# Patient Record
Sex: Female | Born: 1960 | Race: White | Hispanic: No | Marital: Single | Smoking: Current every day smoker
Health system: Southern US, Community
[De-identification: ages and names within clinical notes are randomized; demographics above are authoritative.]

## PROBLEM LIST (undated history)

## (undated) DIAGNOSIS — F329 Major depressive disorder, single episode, unspecified: Secondary | ICD-10-CM

## (undated) DIAGNOSIS — F32A Depression, unspecified: Secondary | ICD-10-CM

## (undated) DIAGNOSIS — E785 Hyperlipidemia, unspecified: Secondary | ICD-10-CM

## (undated) HISTORY — DX: Depression, unspecified: F32.A

## (undated) HISTORY — DX: Major depressive disorder, single episode, unspecified: F32.9

---

## 2001-03-14 ENCOUNTER — Other Ambulatory Visit: Admission: RE | Admit: 2001-03-14 | Discharge: 2001-03-14 | Payer: Self-pay | Admitting: Obstetrics & Gynecology

## 2002-03-29 ENCOUNTER — Other Ambulatory Visit: Admission: RE | Admit: 2002-03-29 | Discharge: 2002-03-29 | Payer: Self-pay | Admitting: Obstetrics & Gynecology

## 2003-04-04 ENCOUNTER — Other Ambulatory Visit: Admission: RE | Admit: 2003-04-04 | Discharge: 2003-04-04 | Payer: Self-pay | Admitting: Obstetrics & Gynecology

## 2003-08-23 ENCOUNTER — Ambulatory Visit (HOSPITAL_COMMUNITY): Admission: RE | Admit: 2003-08-23 | Discharge: 2003-08-23 | Payer: Self-pay | Admitting: Cardiothoracic Surgery

## 2003-08-27 ENCOUNTER — Ambulatory Visit (HOSPITAL_COMMUNITY): Admission: RE | Admit: 2003-08-27 | Discharge: 2003-08-27 | Payer: Self-pay | Admitting: Cardiothoracic Surgery

## 2003-08-31 ENCOUNTER — Encounter: Admission: RE | Admit: 2003-08-31 | Discharge: 2003-08-31 | Payer: Self-pay | Admitting: Cardiothoracic Surgery

## 2003-09-12 ENCOUNTER — Encounter: Admission: RE | Admit: 2003-09-12 | Discharge: 2003-09-12 | Payer: Self-pay | Admitting: Cardiothoracic Surgery

## 2003-10-19 ENCOUNTER — Encounter: Admission: RE | Admit: 2003-10-19 | Discharge: 2003-10-19 | Payer: Self-pay | Admitting: Cardiothoracic Surgery

## 2003-12-21 ENCOUNTER — Encounter: Admission: RE | Admit: 2003-12-21 | Discharge: 2003-12-21 | Payer: Self-pay | Admitting: Cardiothoracic Surgery

## 2004-04-04 ENCOUNTER — Other Ambulatory Visit: Admission: RE | Admit: 2004-04-04 | Discharge: 2004-04-04 | Payer: Self-pay | Admitting: Obstetrics & Gynecology

## 2004-04-07 ENCOUNTER — Other Ambulatory Visit: Admission: RE | Admit: 2004-04-07 | Discharge: 2004-04-07 | Payer: Self-pay | Admitting: Obstetrics & Gynecology

## 2004-05-23 ENCOUNTER — Encounter: Admission: RE | Admit: 2004-05-23 | Discharge: 2004-05-23 | Payer: Self-pay | Admitting: Cardiothoracic Surgery

## 2005-05-08 ENCOUNTER — Other Ambulatory Visit: Admission: RE | Admit: 2005-05-08 | Discharge: 2005-05-08 | Payer: Self-pay | Admitting: Obstetrics & Gynecology

## 2011-10-13 ENCOUNTER — Ambulatory Visit (INDEPENDENT_AMBULATORY_CARE_PROVIDER_SITE_OTHER): Payer: Managed Care, Other (non HMO) | Admitting: Psychology

## 2011-10-13 ENCOUNTER — Encounter (HOSPITAL_COMMUNITY): Payer: Self-pay | Admitting: Psychology

## 2011-10-13 DIAGNOSIS — F329 Major depressive disorder, single episode, unspecified: Secondary | ICD-10-CM

## 2011-10-13 DIAGNOSIS — Z634 Disappearance and death of family member: Secondary | ICD-10-CM
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
## 2011-10-20 ENCOUNTER — Ambulatory Visit (INDEPENDENT_AMBULATORY_CARE_PROVIDER_SITE_OTHER): Payer: Managed Care, Other (non HMO) | Admitting: Psychology

## 2011-10-20 DIAGNOSIS — Z634 Disappearance and death of family member: Secondary | ICD-10-CM

## 2011-10-20 DIAGNOSIS — F4329 Adjustment disorder with other symptoms: Secondary | ICD-10-CM

## 2011-10-20 DIAGNOSIS — F4320 Adjustment disorder, unspecified: Secondary | ICD-10-CM
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
## 2011-10-27 ENCOUNTER — Ambulatory Visit (INDEPENDENT_AMBULATORY_CARE_PROVIDER_SITE_OTHER): Payer: Managed Care, Other (non HMO) | Admitting: Psychology

## 2011-10-27 DIAGNOSIS — F4329 Adjustment disorder with other symptoms: Secondary | ICD-10-CM

## 2011-10-27 DIAGNOSIS — F4321 Adjustment disorder with depressed mood: Secondary | ICD-10-CM

## 2011-10-27 DIAGNOSIS — F4310 Post-traumatic stress disorder, unspecified: Secondary | ICD-10-CM
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
## 2011-11-03 ENCOUNTER — Ambulatory Visit (INDEPENDENT_AMBULATORY_CARE_PROVIDER_SITE_OTHER): Payer: Managed Care, Other (non HMO) | Admitting: Psychology

## 2011-11-03 DIAGNOSIS — F329 Major depressive disorder, single episode, unspecified: Secondary | ICD-10-CM

## 2011-11-03 DIAGNOSIS — F4310 Post-traumatic stress disorder, unspecified: Secondary | ICD-10-CM

## 2011-11-03 DIAGNOSIS — Z634 Disappearance and death of family member: Secondary | ICD-10-CM

## 2011-11-03 NOTE — Progress Notes (Signed)
   THERAPIST PROGRESS NOTE  Session Time: 1030 - 1125  Participation Level: Active  Behavioral Response: Fairly GroomedAlertAnxious and Depressed and overwhelmed with the need to make many decisions  Type of Therapy: Individual Therapy  Treatment Goals addressed: Coping  Interventions: Psychosocial Skills: techniques for holding onto favorite memories, Supportive and Reframing  Summary: Rhonda Perkins is a 51 y.o. female who presents with so much acute stress that she is constantly overwhelmed with all that she needs to do and to decide. She is at the point of needing to sign a building contract and to have her parents house finally demolished so the new house can be built.  She dreads the house coming down, because it will signal the finality of her mother's death and the lose of hope of things "being the same again".  She understands the need to go ahead and get the building started, because she hates living in an apartment.  We have been working for several weeks on ways to hold her mother close while letting her home go.  This weekend, she was able to say what plants she wanted to salvage and also planted some strawberries with her Dad in memory of her mother.  Suicidal/Homicidal: Nowithout intent/plan  Therapist Response: Validated her loss and the difficulty of grieving when all the decisions are up to her and people are pushing her with daily calls to move on.  She was less tearful today, has found some things to do that are comforting to her (brushed the dog for 2 hours!).  She expressed determination to get the house decision done this coming week.  Asked her about whether to watch to demolition or avoid it -- believes she will be there, but can also be distracted caring for the dogs and keeping them safe during that time.  Plan: Return again in 1 weeks. Support of her grieving process and the very real burden she is living under at this time.  Diagnosis: Axis I: Bereavement, Major  Depression, single episode and Post Traumatic Stress Disorder    Axis II: No diagnosis    Derinda Bartus, RN 11/03/2011

## 2011-11-17 ENCOUNTER — Ambulatory Visit (INDEPENDENT_AMBULATORY_CARE_PROVIDER_SITE_OTHER): Payer: Managed Care, Other (non HMO) | Admitting: Psychology

## 2011-11-17 DIAGNOSIS — Z634 Disappearance and death of family member: Secondary | ICD-10-CM

## 2011-11-17 DIAGNOSIS — F4310 Post-traumatic stress disorder, unspecified: Secondary | ICD-10-CM
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
## 2011-12-04 ENCOUNTER — Ambulatory Visit (INDEPENDENT_AMBULATORY_CARE_PROVIDER_SITE_OTHER): Payer: Managed Care, Other (non HMO) | Admitting: Psychology

## 2011-12-04 DIAGNOSIS — F4310 Post-traumatic stress disorder, unspecified: Secondary | ICD-10-CM

## 2011-12-04 NOTE — Progress Notes (Signed)
   THERAPIST PROGRESS NOTE  Session Time: 1520 - 1610  Participation Level: Active  Behavioral Response: Neat and very thinAlertAngry, Anxious and Depressed  Type of Therapy: Individual Therapy  Treatment Goals addressed: Anger and Coping  Interventions: Supportive and Reframing  Summary: Rhonda Perkins is a 51 y.o. female who presents with tense affect and having had "a hell of a week".  She continues to work full time while caring for her father and then her brother was moved to a "rehab" facility that she has found to be inadequate in caring for his needs.  Therefore, she has "had" to go there every evening and supervise his care the best she can.  She has initiated complaints about the facility at all levels up to the state level, and so far has been able to make her wishes known directly and get some action at the home.  But the strain of knowing he is there and the effort to continue to keep up with the frequent contacts with insurance agents, banks, and now trying to get [name]'s disability payments initiated is taking a big toll on her physically.    She remains logical, assertive and able to do the tasks individually, but is getting tired and overwhelmed, as demonstrated by her lack of appetite and very poor diet.  When she eats it is mostly easy to grab carbs and very little protein.    Suicidal/Homicidal: Nowithout intent/plan  Therapist Response:   Recognized that she is doing the best she can do at this time.  Gave her the opportunity to vent and together we concluded that the nursing home will do better with [name] over time, as the staff get to know him and his needs better.  Still, it was appropriate for her to complain.  Suggested that she needs to be in contact with the social worker at the home and have someone participate in his treatment planning session.  She immediately thought about who could do this if she cannot be there because of work.  Plan: Return again in 2  weeks.  We continue to focus on supporting her to cope with all of her caretaking duties while not letting her get away with assuming she has to do it all alone.    Diagnosis: Axis I: Adjustment Disorder with Mixed Emotional Features and Post Traumatic Stress Disorder    Axis II: No diagnosis    Eron Goble, RN 12/04/2011

## 2011-12-14 ENCOUNTER — Ambulatory Visit (HOSPITAL_COMMUNITY): Payer: Self-pay | Admitting: Licensed Clinical Social Worker

## 2011-12-29 ENCOUNTER — Ambulatory Visit (INDEPENDENT_AMBULATORY_CARE_PROVIDER_SITE_OTHER): Payer: Managed Care, Other (non HMO) | Admitting: Psychology

## 2011-12-29 DIAGNOSIS — F322 Major depressive disorder, single episode, severe without psychotic features: Secondary | ICD-10-CM

## 2011-12-29 DIAGNOSIS — Z634 Disappearance and death of family member: Secondary | ICD-10-CM
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
## 2012-01-05 ENCOUNTER — Ambulatory Visit (INDEPENDENT_AMBULATORY_CARE_PROVIDER_SITE_OTHER): Payer: Managed Care, Other (non HMO) | Admitting: Psychology

## 2012-01-05 DIAGNOSIS — F4310 Post-traumatic stress disorder, unspecified: Secondary | ICD-10-CM

## 2012-01-05 NOTE — Progress Notes (Signed)
   THERAPIST PROGRESS NOTE  Session Time: 1505 - 1555  Participation Level: Active  Behavioral Response: Casual and NeatAlertDysphoric, and tense  Type of Therapy: Individual Therapy  Treatment Goals addressed: Coping  Interventions: Supportive  Summary: ALFREDO COLLYMORE is a 51 y.o. female who presents with less agitation than last week.  No major crisis has come up this week.  Her brother was released from the hospital today, back to the rehab unit.  The house is being built, with the foundation nearly completed.  Her Dad is staying busy watching the progress on the house.  She is going to work regularly, but still has little appetite, eating cereal often as a meal.  She did get out this past weekend, taking her Dad to yard sales and a road trip with her sister.    Suicidal/Homicidal: Nowithout intent/plan  Therapist Response: We continued to work on Pharmacologist for anxiety and tension.  She reports she has been using the breathing technique we talked about last week.  Today, I taught her how to do progressive muscle relaxation and we tried it out together.  She noticed good effect from this and we talked about using it before bed.  I also encouraged her to do something for herself during the next hour, because she is not expected home until after 5 pm.  She thought she would like to window shop.  We reviewed her progress over the 3 months and said our goodbyes.    Plan: Return again to see new therapist at first available appointment.  Diagnosis: Axis I: Depressive Disorder NOS and Post Traumatic Stress Disorder    Axis II: No diagnosis    Jefte Carithers, RN 01/05/2012

## 2012-01-19 ENCOUNTER — Ambulatory Visit (INDEPENDENT_AMBULATORY_CARE_PROVIDER_SITE_OTHER): Payer: Managed Care, Other (non HMO) | Admitting: Psychology

## 2012-01-19 DIAGNOSIS — F321 Major depressive disorder, single episode, moderate: Secondary | ICD-10-CM

## 2012-01-20 NOTE — Progress Notes (Signed)
   THERAPIST PROGRESS NOTE  Session Time: 3pm-4pm  Participation Level: Active  Behavioral Response: Well GroomedAlertDysphoric  Type of Therapy: Individual Therapy  Treatment Goals addressed: Coping and Diagnosis: MDD and bereavement  Interventions: CBT and Other: Heartmath Quick Coherence Technique  Summary: Rhonda Perkins is a 51 y.o. female who presents with reported feeling overwhelmed by all responsibilities as caretaker for father, brother and new home being built.  Pt discussed the stressors of brother being readmitted to the hospital for pneumonia and not feeling he had good care at rehab center.  Pt also discusses the stress of everyone looking to her to do things- pt was able to recognize she has her own limitations but speaks of being the one who will get it done.  Pt discussed grief of mother "on back burner" w/ all the caretaker she is doing- but acknowledged that 01/22/12 will be difficult as this is her mother's birthday.  Pt agreed to take some time to remember mother in positive way.  Pt also participated in heartmath breathing exercise- reporting will continue to practice on own.  Suicidal/Homicidal: Nowithout intent/plan  Therapist Response: Acknowledged pt transition to new therapist and time can take to become comfortable w/ new provider. Assessed pt current functioning per her report.  acknowledged and validated stressors.  Assisted pt in reframing that she doesn't have to "do it all" and accepting self limitations and seeking assistance of others.   Disucssed mother's birthday coming up and importance of memorializing w/ loved ones on this day.  Introduced pt to heart math quick coherence technique for stress relief. Plan: Return again in 2 weeks.  Diagnosis: Axis I: Bereavement and MDD     Axis II: No diagnosis    YATES,LEANNE, LPC 01/20/2012

## 2012-02-03 ENCOUNTER — Ambulatory Visit (INDEPENDENT_AMBULATORY_CARE_PROVIDER_SITE_OTHER): Payer: Managed Care, Other (non HMO) | Admitting: Psychology

## 2012-02-03 DIAGNOSIS — F321 Major depressive disorder, single episode, moderate: Secondary | ICD-10-CM

## 2012-02-03 NOTE — Progress Notes (Signed)
   THERAPIST PROGRESS NOTE  Session Time: 4:05pm-4:48pm  Participation Level: Active  Behavioral Response: Well GroomedAlert and DrowsyDepressed  Type of Therapy: Individual Therapy  Treatment Goals addressed: Diagnosis: MDD and bereavement  Interventions: Strength-based and Supportive  Summary: Rhonda Perkins is a 51 y.o. female who presents with reported feeling of exhaustion.  Pt appears tired in session.  Pt however was alert and participated in session .  Pt reported that her brother was d/c yesterday and is home w/ outpt therapy.  Pt reported that she is crashing at night- but not feeling rested and discusses feeling overwhelmed w/ responsibilities but carrying on w/ accomplishing them.  Pt identified need to find time for some rest for self and positive self care for self to maintain her wellness.   Suicidal/Homicidal: Nowithout intent/plan  Therapist Response: ASsessed pt current functioning per her report.  Validated pt feelings and stressors as overwhelming.  Discussed impact having on her wellbeing and feelings of exhaustion.  Encouraged pt need for finding time for herself- not taking on more responsibilities and turning over responsibilities to others as much as possible.    Plan: Return again in 2-3 weeks.  Diagnosis: Axis I: Major Depression, single episode    Axis II: No diagnosis    YATES,LEANNE, LPC 02/03/2012

## 2012-02-29 ENCOUNTER — Ambulatory Visit (HOSPITAL_COMMUNITY): Payer: Self-pay | Admitting: Psychology

## 2012-02-29 ENCOUNTER — Telehealth (HOSPITAL_COMMUNITY): Payer: Self-pay | Admitting: Psychology

## 2012-02-29 NOTE — Telephone Encounter (Signed)
Called re: missed appointment today.  Left message on voice mail.

## 2012-06-27 ENCOUNTER — Encounter (HOSPITAL_COMMUNITY): Payer: Self-pay | Admitting: Psychology

## 2012-06-27 DIAGNOSIS — Z634 Disappearance and death of family member: Secondary | ICD-10-CM

## 2012-06-27 DIAGNOSIS — F329 Major depressive disorder, single episode, unspecified: Secondary | ICD-10-CM

## 2012-06-27 NOTE — Progress Notes (Signed)
Patient ID: Rhonda Perkins, female   DOB: 1961/01/16, 52 y.o.   MRN: [id number] Outpatient Therapist Discharge Summary  CANDY LEVERETT    02/01/1961   Admission Date: 10/13/11   Discharge Date:  06/28/11 Reason for Discharge:  Pt hadn't returned since 01/2012 Diagnosis:  1. Bereavement, uncomplicated   2. Depression      Comments:  Pt was first in tx w/ Shonna Chock till her retirement in 12/2011.  Pt f/u 2 times w/ new provider.  Forde Radon

## 2016-01-10 ENCOUNTER — Observation Stay (HOSPITAL_COMMUNITY)
Admission: EM | Admit: 2016-01-10 | Discharge: 2016-01-11 | Disposition: A | Discharge status: Home / Self Care | Payer: Managed Care, Other (non HMO) | Attending: Family Medicine | Admitting: Family Medicine

## 2016-01-10 ENCOUNTER — Emergency Department (HOSPITAL_COMMUNITY): Payer: Managed Care, Other (non HMO)

## 2016-01-10 ENCOUNTER — Encounter (HOSPITAL_COMMUNITY): Payer: Self-pay | Admitting: Neurology

## 2016-01-10 ENCOUNTER — Observation Stay (HOSPITAL_COMMUNITY): Payer: Managed Care, Other (non HMO)

## 2016-01-10 DIAGNOSIS — E785 Hyperlipidemia, unspecified: Secondary | ICD-10-CM | POA: Diagnosis not present

## 2016-01-10 DIAGNOSIS — M79602 Pain in left arm: Secondary | ICD-10-CM | POA: Diagnosis not present

## 2016-01-10 DIAGNOSIS — Z7982 Long term (current) use of aspirin: Secondary | ICD-10-CM | POA: Diagnosis not present

## 2016-01-10 DIAGNOSIS — F17210 Nicotine dependence, cigarettes, uncomplicated: Secondary | ICD-10-CM | POA: Diagnosis not present

## 2016-01-10 DIAGNOSIS — E7800 Pure hypercholesterolemia, unspecified: Secondary | ICD-10-CM | POA: Diagnosis not present

## 2016-01-10 DIAGNOSIS — R531 Weakness: Secondary | ICD-10-CM | POA: Insufficient documentation

## 2016-01-10 DIAGNOSIS — G459 Transient cerebral ischemic attack, unspecified: Secondary | ICD-10-CM

## 2016-01-10 DIAGNOSIS — I10 Essential (primary) hypertension: Secondary | ICD-10-CM | POA: Diagnosis not present

## 2016-01-10 DIAGNOSIS — R29898 Other symptoms and signs involving the musculoskeletal system: Secondary | ICD-10-CM | POA: Diagnosis not present

## 2016-01-10 DIAGNOSIS — R200 Anesthesia of skin: Secondary | ICD-10-CM | POA: Diagnosis not present

## 2016-01-10 DIAGNOSIS — F329 Major depressive disorder, single episode, unspecified: Secondary | ICD-10-CM | POA: Diagnosis not present

## 2016-01-10 LAB — COMPREHENSIVE METABOLIC PANEL
ALK PHOS: 65 U/L (ref 38–126)
ALT: 16 U/L (ref 14–54)
ANION GAP: 7 (ref 5–15)
AST: 20 U/L (ref 15–41)
Albumin: 4.4 g/dL (ref 3.5–5.0)
BILIRUBIN TOTAL: 1.3 mg/dL — AB (ref 0.3–1.2)
BUN: <5 mg/dL — ABNORMAL LOW (ref 6–20)
CALCIUM: 9.8 mg/dL (ref 8.9–10.3)
CO2: 26 mmol/L (ref 22–32)
CREATININE: 0.73 mg/dL (ref 0.44–1.00)
Chloride: 109 mmol/L (ref 101–111)
Glucose, Bld: 86 mg/dL (ref 65–99)
Potassium: 4 mmol/L (ref 3.5–5.1)
SODIUM: 142 mmol/L (ref 135–145)
TOTAL PROTEIN: 6.8 g/dL (ref 6.5–8.1)

## 2016-01-10 LAB — CBC
HEMATOCRIT: 41.5 % (ref 36.0–46.0)
Hemoglobin: 13.8 g/dL (ref 12.0–15.0)
MCH: 29.1 pg (ref 26.0–34.0)
MCHC: 33.3 g/dL (ref 30.0–36.0)
MCV: 87.4 fL (ref 78.0–100.0)
PLATELETS: 235 10*3/uL (ref 150–400)
RBC: 4.75 MIL/uL (ref 3.87–5.11)
RDW: 12.9 % (ref 11.5–15.5)
WBC: 7.2 10*3/uL (ref 4.0–10.5)

## 2016-01-10 LAB — I-STAT CHEM 8, ED
BUN: 8 mg/dL (ref 6–20)
CALCIUM ION: 1.2 mmol/L (ref 1.13–1.30)
Chloride: 106 mmol/L (ref 101–111)
Creatinine, Ser: 0.7 mg/dL (ref 0.44–1.00)
GLUCOSE: 82 mg/dL (ref 65–99)
HCT: 41 % (ref 36.0–46.0)
HEMOGLOBIN: 13.9 g/dL (ref 12.0–15.0)
Potassium: 4.2 mmol/L (ref 3.5–5.1)
SODIUM: 145 mmol/L (ref 135–145)
TCO2: 29 mmol/L (ref 0–100)

## 2016-01-10 LAB — DIFFERENTIAL
Basophils Absolute: 0 10*3/uL (ref 0.0–0.1)
Basophils Relative: 0 %
EOS PCT: 1 %
Eosinophils Absolute: 0.1 10*3/uL (ref 0.0–0.7)
LYMPHS PCT: 42 %
Lymphs Abs: 3 10*3/uL (ref 0.7–4.0)
MONO ABS: 0.4 10*3/uL (ref 0.1–1.0)
MONOS PCT: 6 %
Neutro Abs: 3.7 10*3/uL (ref 1.7–7.7)
Neutrophils Relative %: 51 %

## 2016-01-10 LAB — TSH: TSH: 1.556 u[IU]/mL (ref 0.350–4.500)

## 2016-01-10 LAB — PROTIME-INR
INR: 1.07 (ref 0.00–1.49)
PROTHROMBIN TIME: 14.1 s (ref 11.6–15.2)

## 2016-01-10 LAB — APTT: aPTT: 32 seconds (ref 24–37)

## 2016-01-10 LAB — I-STAT TROPONIN, ED: TROPONIN I, POC: 0 ng/mL (ref 0.00–0.08)

## 2016-01-10 MED ORDER — SENNOSIDES-DOCUSATE SODIUM 8.6-50 MG PO TABS
1.0000 | ORAL_TABLET | Freq: Every evening | ORAL | Status: DC | PRN
Start: 2016-01-10 — End: 2016-01-11

## 2016-01-10 MED ORDER — SODIUM CHLORIDE 0.9 % IV SOLN
INTRAVENOUS | Status: AC
  Administered 2016-01-10: 18:00:00 via INTRAVENOUS

## 2016-01-10 MED ORDER — ASPIRIN 81 MG PO CHEW
81.0000 mg | CHEWABLE_TABLET | Freq: Every day | ORAL | Status: DC
  Administered 2016-01-10 – 2016-01-11 (×2): 81 mg via ORAL
  Filled 2016-01-10 (×2): qty 1

## 2016-01-10 MED ORDER — ENOXAPARIN SODIUM 40 MG/0.4ML ~~LOC~~ SOLN
40.0000 mg | SUBCUTANEOUS | Status: DC
  Administered 2016-01-10: 40 mg via SUBCUTANEOUS
  Filled 2016-01-10: qty 0.4

## 2016-01-10 MED ORDER — SIMVASTATIN 20 MG PO TABS
20.0000 mg | ORAL_TABLET | Freq: Every day | ORAL | Status: DC
  Administered 2016-01-10: 20 mg via ORAL
  Filled 2016-01-10: qty 1

## 2016-01-10 MED ORDER — STROKE: EARLY STAGES OF RECOVERY BOOK
Freq: Once | Status: AC
  Administered 2016-01-10: 19:00:00

## 2016-01-10 NOTE — Progress Notes (Signed)
Pt arrived to 5M01 @ 1618, Pt A&Ox 4, c/o pain 0/10.  Pt VS taken, Pt without distress. Family at the bedside. Diet ordered, will monitor.

## 2016-01-10 NOTE — ED Notes (Signed)
Was up really early and was weed eating her yard and she went to put up  It up and her left arrm felt  Weak  And  It would not move , went to [facility] and was sent here for further tests, pt now able to move arm  Well, has normal ROM ,Without drift, no slurred speech

## 2016-01-10 NOTE — ED Notes (Signed)
Pt transported to CT ?

## 2016-01-10 NOTE — ED Provider Notes (Signed)
CSN: [id number]     Arrival date & time 01/10/16  1302 History   First MD Initiated Contact with Patient 01/10/16 1343     Chief Complaint  Patient presents with  . Weakness     (Consider location/radiation/quality/duration/timing/severity/associated sxs/prior Treatment) HPI   Patient is a 55 year old female with past medical history of high cholesterol who presents to the ED with complaint of intermittent left arm weakness, onset 7:30 AM. Patient reports while she was out doing yard work this morning, after using a weed whacker she noticed she was unable to move her left arm. Patient reports she had total left arm weakness for approximately 15 minutes and then notes her strength began to gradually return over the next 30-45 minutes. Patient denies having any similar episodes in the past. She notes she went to her PCPs office but was unable to get an resulting her going to an urgent care. Once patient was evaluated at the urgent care she was advised to come to the ED for further evaluation. Patient currently denies having any pain or complaints at this time. Denies fever, headache, visual changes, lightheadedness, dizziness, slurred speech, confusion, cough, shortness of breath, chest pain, palpitations, abdominal pain, nausea, vomiting, urinary symptoms, numbness, tingling, weakness. Patient reports she has been out of her cholesterol medicine for the past 1.5 weeks. Patient also endorses smoking 1/4 pack per day.   Past Medical History  Diagnosis Date  . Depression    No past surgical history on file. Family History  Problem Relation Age of Onset  . Depression Brother   . Depression Brother   . Seizures Brother    Social History  Substance Use Topics  . Smoking status: Current Every Day Smoker -- 0.25 packs/day for 28 years  . Smokeless tobacco: Never Used  . Alcohol Use: No   OB History    No data available     Review of Systems  Neurological: Positive for weakness (left arm).   All other systems reviewed and are negative.     Allergies  Review of patient's allergies indicates no known allergies.  Home Medications   Prior to Admission medications   Medication Sig Start Date End Date Taking? Authorizing Provider  LORazepam (ATIVAN) 0.5 MG tablet  09/07/11   Historical Provider, MD  mirtazapine (REMERON) 30 MG tablet Take 30 mg by mouth at bedtime.    Historical Provider, MD  PRISTIQ 50 MG 24 hr tablet  09/18/11   Historical Provider, MD  zolpidem (AMBIEN) 10 MG tablet  09/18/11   Historical Provider, MD   BP 142/78 mmHg  Pulse 75  Temp(Src) 97.9 F (36.6 C) (Oral)  Resp 14  Ht 5\' 7"  (1.702 m)  Wt 55.339 kg  BMI 19.10 kg/m2  SpO2 100% Physical Exam  Constitutional: She is oriented to person, place, and time. She appears well-developed and well-nourished. No distress.  HENT:  Head: Normocephalic and atraumatic.  Eyes: Conjunctivae and EOM are normal. Pupils are equal, round, and reactive to light. Right eye exhibits no discharge. Left eye exhibits no discharge. No scleral icterus.  Neck: Normal range of motion. Neck supple.  Cardiovascular: Normal rate, regular rhythm, normal heart sounds and intact distal pulses.   Pulmonary/Chest: Effort normal and breath sounds normal. No respiratory distress. She has no wheezes. She has no rales. She exhibits no tenderness.  Abdominal: Soft. Bowel sounds are normal. She exhibits no distension. There is no tenderness.  Musculoskeletal: Normal range of motion. She exhibits no edema.  FROM of  BUE with 5/5 strength. Sensation grossly intact. 2+ radial pulses. Equal grip strength bilaterally. Cap refill less than 2.  Neurological: She is alert and oriented to person, place, and time. She has normal strength. No cranial nerve deficit or sensory deficit. She displays a negative Romberg sign. Coordination normal.  Normal finger-nose-finger  Skin: Skin is warm and dry. She is not diaphoretic.  Nursing note and vitals  reviewed.   ED Course  Procedures (including critical care time) Labs Review Labs Reviewed  COMPREHENSIVE METABOLIC PANEL - Abnormal; Notable for the following:    BUN <5 (*)    Total Bilirubin 1.3 (*)    All other components within normal limits  PROTIME-INR  APTT  CBC  DIFFERENTIAL  I-STAT TROPOININ, ED  CBG MONITORING, ED  I-STAT CHEM 8, ED    Imaging Review Ct Head Wo Contrast  01/10/2016  CLINICAL DATA:  Patient had numbness in her left arm after weeding eating at 7am EXAM: CT HEAD WITHOUT CONTRAST TECHNIQUE: Contiguous axial images were obtained from the base of the skull through the vertex without intravenous contrast. COMPARISON:  08/23/2003 FINDINGS: Brain: No evidence of acute infarction, hemorrhage, extra-axial collection, ventriculomegaly, or mass effect. Vascular: No hyperdense vessel or unexpected calcification. Skull: Negative for fracture or focal lesion. Sinuses/Orbits: Normal orbits and globes. Clear paranasal sinuses and mastoid air cells. Other: None. IMPRESSION: Normal head CT. Electronically Signed   By: Jeronimo Greaves M.D.   On: 01/10/2016 14:43   I have personally reviewed and evaluated these images and lab results as part of my medical decision-making.   EKG Interpretation None      MDM   Final diagnoses:  Transient cerebral ischemia, unspecified transient cerebral ischemia type  Left arm weakness   Patient presents with transient episode of left arm weakness that started around 7:30 AM. Patient reports symptoms resolved spontaneously after approximately 15 minutes. Denies any other associated symptoms. Endorses history of high cholesterol and smoking daily. VSS. Exam unremarkable, no neuro deficits. Full strength and range of motion of bilateral upper extremities, BUE neurovascularly intact. Labs unremarkable. Head CT negative. EKG showed sinus rhythm. Consults with neurology. Dr. Amada Jupiter advised to have pt admitted to medicine for further TIA workup  and notes they will come evaluate the pt. Consulted hospitalist. Dr. Jimmey Ralph agrees to admission. Orders placed for admission. Discussed results and plan for admission with pt.    Satira Sark [place], [place] 01/10/16 1512   Loren Racer, MD 01/26/16 [phone]

## 2016-01-10 NOTE — Consult Note (Signed)
Requesting Physician: Dr. Jennette Kettle    Chief Complaint: TIA  History obtained from:  Patient     HPI:                                                                                                                                         Rhonda Perkins is an 55 y.o. female with past medical history of high cholesterol and tobacco abuse who presents to the ED with complaint of intermittent left arm weakness, onset 7:30 AM. Patient reports while she was out doing yard work this morning, after using a weed whacker she noticed she was unable to move her left arm. Patient reports she had total left arm weakness for approximately 15 minutes and then notes her strength began to gradually return over the next 30-45 minutes. Patient denies having any similar episodes in the past. She notes she went to her PCPs office but was unable to get an resulting her going to an urgent care. Once patient was evaluated at the urgent care she was advised to come to the ED for further evaluation. Currently back to baseline.   Date last known well: Today Time last known well: Time: 07:30 tPA Given: No: symptoms resolved   Past Medical History  Diagnosis Date  . Depression     No past surgical history on file.  Family History  Problem Relation Age of Onset  . Depression Brother   . Depression Brother   . Seizures Brother   . Hyperlipidemia Mother   . Hyperlipidemia Father    Social History:  reports that she has been smoking.  She has never used smokeless tobacco. She reports that she does not drink alcohol or use illicit drugs.  Allergies: Allergies no known allergies  Medications:                                                                                                                           No current facility-administered medications for this encounter.   Current Outpatient Prescriptions  Medication Sig Dispense Refill  . LORazepam (ATIVAN) 0.5 MG tablet     . mirtazapine (REMERON) 30 MG tablet  Take 30 mg by mouth at bedtime.    Marland Kitchen PRISTIQ 50 MG 24 hr tablet     . zolpidem (AMBIEN) 10 MG tablet        ROS:  History obtained from the patient  General ROS: negative for - chills, fatigue, fever, night sweats, weight gain or weight loss Psychological ROS: negative for - behavioral disorder, hallucinations, memory difficulties, mood swings or suicidal ideation Ophthalmic ROS: negative for - blurry vision, double vision, eye pain or loss of vision ENT ROS: negative for - epistaxis, nasal discharge, oral lesions, sore throat, tinnitus or vertigo Allergy and Immunology ROS: negative for - hives or itchy/watery eyes Hematological and Lymphatic ROS: negative for - bleeding problems, bruising or swollen lymph nodes Endocrine ROS: negative for - galactorrhea, hair pattern changes, polydipsia/polyuria or temperature intolerance Respiratory ROS: negative for - cough, hemoptysis, shortness of breath or wheezing Cardiovascular ROS: negative for - chest pain, dyspnea on exertion, edema or irregular heartbeat Gastrointestinal ROS: negative for - abdominal pain, diarrhea, hematemesis, nausea/vomiting or stool incontinence Genito-Urinary ROS: negative for - dysuria, hematuria, incontinence or urinary frequency/urgency Musculoskeletal ROS: negative for - joint swelling or muscular weakness Neurological ROS: as noted in HPI Dermatological ROS: negative for rash and skin lesion changes  Neurologic Examination:                                                                                                      Blood pressure 141/70, pulse 73, temperature 97.9 F (36.6 C), temperature source Oral, resp. rate 17, height 5\' 7"  (1.702 m), weight 122 lb (55.339 kg), SpO2 99 %.  HEENT-  Normocephalic, no lesions, without obvious abnormality.  Normal external eye and conjunctiva.   Normal TM's bilaterally.  Normal auditory canals and external ears. Normal external nose, mucus membranes and septum.  Normal pharynx. Cardiovascular- S1, S2 normal, pulses palpable throughout   Lungs- chest clear, no wheezing, rales, normal symmetric air entry Abdomen- normal findings: bowel sounds normal Extremities- no edema Lymph-no adenopathy palpable Musculoskeletal-no joint tenderness, deformity or swelling Skin-warm and dry, no hyperpigmentation, vitiligo, or suspicious lesions  Neurological Examination Mental Status: Alert, oriented, thought content appropriate.  Speech fluent without evidence of aphasia.  Able to follow 3 step commands without difficulty. Cranial Nerves: II: ; Visual fields grossly normal, pupils equal, round, reactive to light and accommodation III,IV, VI: ptosis not present, extra-ocular motions intact bilaterally V,VII: smile symmetric, facial light touch sensation normal bilaterally VIII: hearing normal bilaterally IX,X: uvula rises symmetrically XI: bilateral shoulder shrug XII: midline tongue extension Motor: Right : Upper extremity   5/5    Left:     Upper extremity   5/5  Lower extremity   5/5     Lower extremity   5/5 Tone and bulk:normal tone throughout; no atrophy noted Sensory: Pinprick and light touch intact throughout, bilaterally Deep Tendon Reflexes: 2+ and symmetric throughout Plantars: Right: downgoing   Left: downgoing Cerebellar: normal finger-to-nose and normal heel-to-shin test Gait: normal gait and station       Lab Results: Basic Metabolic Panel:  Recent Labs Lab 01/10/16 1318 01/10/16 1326  NA 142 145  K 4.0 4.2  CL 109 106  CO2 26  --   GLUCOSE 86 82  BUN <5* 8  CREATININE 0.73 0.70  CALCIUM  9.8  --     Liver Function Tests:  Recent Labs Lab 01/10/16 1318  AST 20  ALT 16  ALKPHOS 65  BILITOT 1.3*  PROT 6.8  ALBUMIN 4.4   No results for input(s): LIPASE, AMYLASE in the last 168 hours. No results for  input(s): AMMONIA in the last 168 hours.  CBC:  Recent Labs Lab 01/10/16 1318 01/10/16 1326  WBC 7.2  --   NEUTROABS 3.7  --   HGB 13.8 13.9  HCT 41.5 41.0  MCV 87.4  --   PLT 235  --     Cardiac Enzymes: No results for input(s): CKTOTAL, CKMB, CKMBINDEX, TROPONINI in the last 168 hours.  Lipid Panel: No results for input(s): CHOL, TRIG, HDL, CHOLHDL, VLDL, LDLCALC in the last 168 hours.  CBG: No results for input(s): GLUCAP in the last 168 hours.  Microbiology: No results found for this or any previous visit.  Coagulation Studies:  Recent Labs  01/10/16 1318  LABPROT 14.1  INR 1.07    Imaging: Ct Head Wo Contrast  01/10/2016  CLINICAL DATA:  Patient had numbness in her left arm after weeding eating at 7am EXAM: CT HEAD WITHOUT CONTRAST TECHNIQUE: Contiguous axial images were obtained from the base of the skull through the vertex without intravenous contrast. COMPARISON:  08/23/2003 FINDINGS: Brain: No evidence of acute infarction, hemorrhage, extra-axial collection, ventriculomegaly, or mass effect. Vascular: No hyperdense vessel or unexpected calcification. Skull: Negative for fracture or focal lesion. Sinuses/Orbits: Normal orbits and globes. Clear paranasal sinuses and mastoid air cells. Other: None. IMPRESSION: Normal head CT. Electronically Signed   By: Jeronimo GreavesKyle  Talbot M.D.   On: 01/10/2016 14:43       Assessment and plan discussed with with attending physician and they are in agreement.    Felicie MornDavid Smith PA-C [facility] [phone]  01/10/2016, 3:51 PM   Assessment: 55 y.o. female with transient left arm numbness and weakness most consistent with TIA. She is being admitted for TIA workup.  Stroke Risk Factors - none  1. HgbA1c, fasting lipid panel 2. MRI, MRA  of the brain without contrast 3. Frequent neuro checks 4. Echocardiogram 5. Carotid dopplers 6. Prophylactic therapy-Antiplatelet med: Aspirin - dose 325mg  PO or 300mg  PR 7. Risk  factor modification 8. Telemetry monitoring 9. PT consult, OT consult, Speech consult 10. please page stroke NP  Or  PA  Or MD  M-F from 8am -4 pm starting 7/22 as this patient will be followed by the stroke team at this point.   You can look them up on [email or web address]    Ritta SlotMcNeill Seraphim Trow, MD [facility] [phone]  If 7pm- 7am, please page neurology on call as listed in AMION.

## 2016-01-10 NOTE — H&P (Signed)
[facility] Admission History and Physical Service Pager: [phone]  Patient name: Rhonda Perkins Medical record number: [id number] Date of birth: 07-10-1960 Age: 55 y.o. Gender: female  Primary Care Provider: No primary care provider on file. Consultants: Neurology Code Status: Full  Chief Complaint: Left arm numbness  Assessment and Plan: Rhonda Perkins is a 55 y.o. female presenting with left arm numbness. PMH is significant for HLD and tobacco abuse.  Transient Left Arm Numbness, resolved. Concern for TIA. Also possibly brachial plexus injury as patient was using weed eater just prior to symptoms, though would be atypical history. Risk factors include tobacco abuse and HLD. ABCD2 score 2. CT head negative. Symptoms currently completely resolved.  - Monitor on telemetry overnight - Check MR/MRA brain  - Check echo and carotid dopplers - Risk stratification labs: A1c, FLP, TSH - Start ASA 81mg  daily  HLD.  - Continue home simvastatin - Check FLP  Tobacco Abuse. - Encourage cessation  FEN/GI: Heart health, carb modified diet, NS@75cc /hr overnight Prophylaxis: Lovenox  Disposition: Place in observation for above management.   History of Present Illness:  Rhonda Perkins is a 55 y.o. female presenting with left arm numbness.  Patient had an episode of left arm numbness this morning around 7 to 730AM. Before this episode patient was using an electric weed eater. Patient denies any weakness during the episode. No headaches, vision changes, or difficulty speaking or swallowing during this episode. Symptoms lasted for about 15-30 minutes then subsided. She went to her PCP's office but was unable to be seen, so she went to urgent care where she was directed to the ED. Patient has never had a stroke any other similar episodes in the past. Symptoms are currently completely resolved.   Patient denies any chest pain, shortness of breath,  cough, fever, chills, or  rashes.   Review Of Systems: Per HPI, otherwise the remainder of the systems were negative.  Patient Active Problem List   Diagnosis Date Noted  . TIA (transient ischemic attack) 01/10/2016  . Left arm weakness    Past Medical History: Past Medical History  Diagnosis Date  . Depression    Past Surgical History: No past surgical history on file.  Social History: Social History  Substance Use Topics  . Smoking status: Current Every Day Smoker -- 0.25 packs/day for 28 years  . Smokeless tobacco: Never Used  . Alcohol Use: No   Additional social history: None  Please also refer to relevant sections of EMR.  Family History: Family History  Problem Relation Age of Onset  . Depression Brother   . Depression Brother   . Seizures Brother   . Hyperlipidemia Mother   . Hyperlipidemia Father    Allergies and Medications: Allergies no known allergies No current facility-administered medications on file prior to encounter.   Current Outpatient Prescriptions on File Prior to Encounter  Medication Sig Dispense Refill  . LORazepam (ATIVAN) 0.5 MG tablet     . mirtazapine (REMERON) 30 MG tablet Take 30 mg by mouth at bedtime.    Marland Kitchen. PRISTIQ 50 MG 24 hr tablet     . zolpidem (AMBIEN) 10 MG tablet       Objective: BP 141/70 mmHg  Pulse 73  Temp(Src) 97.9 F (36.6 C) (Oral)  Resp 17  Ht 5\' 7"  (1.702 m)  Wt 122 lb (55.339 kg)  BMI 19.10 kg/m2  SpO2 99% Exam: General: 55yo female in NAD lying in hospital bed Eyes: PERRL, EOMI  ENTM: MMM, O/P clear Neck: FROM, supple Cardiovascular: RRR, no murmurs noted Respiratory: NWOB, CTAB Abdomen: S, NT, ND MSK:  No cyanosis or edema Skin: Warm, dry Neuro: CN2-12 intact. Strength 5/5 in upper and lower extremities. FNF intact bilaterally. No pronator drift Psych: Appropriate mood and affect.  Labs and Imaging: CBC BMET   Recent Labs Lab 01/10/16 1318 01/10/16 1326  WBC 7.2  --   HGB 13.8 13.9  HCT 41.5 41.0  PLT 235  --      Recent Labs Lab 01/10/16 1318 01/10/16 1326  NA 142 145  K 4.0 4.2  CL 109 106  CO2 26  --   BUN <5* 8  CREATININE 0.73 0.70  GLUCOSE 86 82  CALCIUM 9.8  --      Trop 0  EKG: NSR, no ischemic changes  Ct Head Wo Contrast  01/10/2016  CLINICAL DATA:  Patient had numbness in her left arm after weeding eating at 7am EXAM: CT HEAD WITHOUT CONTRAST TECHNIQUE: Contiguous axial images were obtained from the base of the skull through the vertex without intravenous contrast. COMPARISON:  08/23/2003 FINDINGS: Brain: No evidence of acute infarction, hemorrhage, extra-axial collection, ventriculomegaly, or mass effect. Vascular: No hyperdense vessel or unexpected calcification. Skull: Negative for fracture or focal lesion. Sinuses/Orbits: Normal orbits and globes. Clear paranasal sinuses and mastoid air cells. Other: None. IMPRESSION: Normal head CT. Electronically Signed   By: Jeronimo Greaves M.D.   On: 01/10/2016 14:43   Ardith Dark, MD 01/10/2016, 4:13 PM PGY-3, [facility]PT[facility] Intern pager: [phone], text pages welcome

## 2016-01-11 ENCOUNTER — Observation Stay (HOSPITAL_BASED_OUTPATIENT_CLINIC_OR_DEPARTMENT_OTHER): Payer: Managed Care, Other (non HMO)

## 2016-01-11 DIAGNOSIS — E785 Hyperlipidemia, unspecified: Secondary | ICD-10-CM | POA: Diagnosis not present

## 2016-01-11 DIAGNOSIS — G459 Transient cerebral ischemic attack, unspecified: Secondary | ICD-10-CM

## 2016-01-11 DIAGNOSIS — R29898 Other symptoms and signs involving the musculoskeletal system: Secondary | ICD-10-CM | POA: Diagnosis not present

## 2016-01-11 LAB — LIPID PANEL
Cholesterol: 188 mg/dL (ref 0–200)
HDL: 47 mg/dL (ref >40)
LDL CALC: 124 mg/dL — AB (ref 0–99)
TRIGLYCERIDES: 87 mg/dL (ref <150)
Total CHOL/HDL Ratio: 4 RATIO
VLDL: 17 mg/dL (ref 0–40)

## 2016-01-11 LAB — ECHOCARDIOGRAM COMPLETE
HEIGHTINCHES: 67 in
WEIGHTICAEL: 1952 [oz_av]

## 2016-01-11 LAB — HEMOGLOBIN A1C
HEMOGLOBIN A1C: 5.4 % (ref 4.8–5.6)
MEAN PLASMA GLUCOSE: 108 mg/dL

## 2016-01-11 MED ORDER — SIMVASTATIN 20 MG PO TABS: 20.0000 mg | ORAL_TABLET | Freq: Every day | ORAL | Status: AC

## 2016-01-11 MED ORDER — ASPIRIN 81 MG PO CHEW: 81.0000 mg | CHEWABLE_TABLET | Freq: Every day | ORAL | Status: AC

## 2016-01-11 MED ORDER — SIMVASTATIN 40 MG PO TABS: 40.0000 mg | ORAL_TABLET | Freq: Every day | ORAL | Status: DC

## 2016-01-11 NOTE — Progress Notes (Signed)
  Echocardiogram 2D Echocardiogram has been performed.  Arvil Chaco 01/11/2016, 2:27 PM

## 2016-01-11 NOTE — Discharge Instructions (Signed)
Transient Ischemic Attack °A transient ischemic attack (TIA) is a "warning stroke" that causes stroke-like symptoms. A TIA does not cause lasting damage to the brain. The symptoms of a TIA can happen fast and do not last long. It is important to know the symptoms of a TIA and what to do. This can help prevent stroke or death.  °HOME CARE  °· Take medicines only as told by your doctor. Make sure you understand all of the instructions. °· You may need to take aspirin or warfarin medicine. Warfarin needs to be taken exactly as told. °¨ Taking too much or too little warfarin is dangerous. Blood tests must be done as often as told by your doctor. A PT blood test measures how long it takes for blood to clot. Your PT is used to calculate another value called an INR. Your PT and INR help your doctor adjust your warfarin dosage. He or she will make sure you are taking the right amount. °¨ Food can cause problems with warfarin and affect the results of your blood tests. This is true for foods high in vitamin K. Eat the same amount of foods high in vitamin K each day. Foods high in vitamin K include spinach, kale, broccoli, cabbage, collard and turnip greens, Brussels sprouts, peas, cauliflower, seaweed, and parsley. Other foods high in vitamin K include beef and pork liver, green tea, and soybean oil. Eat the same amount of foods high in vitamin K each day. Avoid big changes in your diet. Tell your doctor before changing your diet. Talk to a food specialist (dietitian) if you have questions. °¨ Many medicines can cause problems with warfarin and affect your PT and INR. Tell your doctor about all medicines you take. This includes vitamins and dietary pills (supplements). Do not take or stop taking any prescribed or over-the-counter medicines unless your doctor tells you to. °¨ Warfarin can cause more bruising or bleeding. Hold pressure over any cuts for longer than normal. Talk to your doctor about other side effects of  warfarin. °¨ Avoid sports or activities that may cause injury or bleeding. °¨ Be careful when you shave, floss, or use sharp objects. °¨ Avoid or drink very little alcohol while taking warfarin. Tell your doctor if you change how much alcohol you drink. °¨ Tell your dentist and other doctors that you take warfarin before any procedures. °· Follow your diet program as told, if you are given one. °· Keep a healthy weight. °· Stay active. Try to get at least 30 minutes of activity on all or most days. °· Do not use any tobacco products, including cigarettes, chewing tobacco, or electronic cigarettes. If you need help quitting, ask your doctor. °· Limit alcohol intake to no more than 1 drink per day for nonpregnant women and 2 drinks per day for men. One drink equals 12 ounces of beer, 5 ounces of wine, or 1½ ounces of hard liquor. °· Do not abuse drugs. °· Keep your home safe so you do not fall. You can do this by: °¨ Putting grab bars in the bedroom and bathroom. °¨ Raising toilet seats. °¨ Putting a seat in the shower. °· Keep all follow-up visits as told by your doctor. This is important. °GET HELP IF: °· Your personality changes. °· You have trouble swallowing. °· You have double vision. °· You are dizzy. °· You have a fever. °GET HELP RIGHT AWAY IF:  °These symptoms may be an emergency. Do not wait to see if the   symptoms will go away. Get medical help right away. Call your local emergency services (911 in the [place] Do not drive yourself to the hospital. °· You have sudden weakness or lose feeling (go numb), especially on one side of the body. This can affect your: °¨ Face. °¨ Arm. °¨ Leg. °· You have sudden trouble walking. °· You have sudden trouble moving your arms or legs. °· You have sudden confusion. °· You have trouble talking. °· You have trouble understanding. °· You have sudden trouble seeing in one or both eyes. °· You lose your balance. °· Your movements are not smooth. °· You have a sudden, very bad  headache with no known cause. °· You have new chest pain. °· Your heartbeat is unsteady. °· You are partly or totally unaware of what is going on around you. °MAKE SURE YOU:  °· Understand these instructions. °· Will watch your condition. °· Will get help right away if you are not doing well or get worse. °  °This information is not intended to replace advice given to you by your health care provider. Make sure you discuss any questions you have with your health care provider. °  °Document Released: 03/17/2008 Document Revised: 06/29/2014 Document Reviewed: 09/13/2013 °Elsevier Interactive Patient Education ©2016 [employer] ° °

## 2016-01-11 NOTE — Progress Notes (Signed)
Occupational Therapy Evaluation/Discharge Patient Details Name: Rhonda Perkins MRN: [id number] DOB: 03/29/1961 Today's Date: 01/11/2016    History of Present Illness 55 y.o. female admitted for intermittent LUE weakness and numbness. MRI (-) for acute event. PMH significant for depression.   Clinical Impression   Patient has been evaluated by Occupational Therapy with no acute OT needs identified. Pt able to complete all ADLs and transfers independently and scored 24/24 on DGI balance assessment indicating low-no fall risk. All education has been completed and pt has no further questions. Pt with no further acute OT needs. OT signing off.    Follow Up Recommendations  No OT follow up    Equipment Recommendations  None recommended by OT    Recommendations for Other Services       Precautions / Restrictions Precautions Precautions: None Restrictions Weight Bearing Restrictions: No      Mobility Bed Mobility Overal bed mobility: Independent                Transfers Overall transfer level: Independent                    Balance Overall balance assessment: Independent                               Standardized Balance Assessment Standardized Balance Assessment : Dynamic Gait Index   Dynamic Gait Index Level Surface: Normal Change in Gait Speed: Normal Gait with Horizontal Head Turns: Normal Gait with Vertical Head Turns: Normal Gait and Pivot Turn: Normal Step Over Obstacle: Normal Step Around Obstacles: Normal Steps: Normal Total Score: 24      ADL Overall ADL's : Independent                                             Vision Vision Assessment?: No apparent visual deficits   Perception     Praxis      Pertinent Vitals/Pain Pain Assessment: No/denies pain     Hand Dominance Right   Extremity/Trunk Assessment Upper Extremity Assessment Upper Extremity Assessment: Overall WFL for tasks assessed    Lower Extremity Assessment Lower Extremity Assessment: Overall WFL for tasks assessed   Cervical / Trunk Assessment Cervical / Trunk Assessment: Normal   Communication Communication Communication: No difficulties   Cognition Arousal/Alertness: Awake/alert Behavior During Therapy: WFL for tasks assessed/performed (frustrated having to wait for more testing) Overall Cognitive Status: Within Functional Limits for tasks assessed                     General Comments       Exercises       Shoulder Instructions      Home Living Family/patient expects to be discharged to:: Private residence Living Arrangements: Other relatives (caregiver for brother) Available Help at Discharge: Family;Available 24 hours/day Type of Home: House Home Access: Stairs to enter [employer] of Steps: 2 Entrance Stairs-Rails: Right Home Layout: One level     Bathroom Shower/Tub: Tub/shower unit Shower/tub characteristics: Curtain Firefighter: Standard     Home Equipment: Environmental consultant - 2 wheels;Cane - single point;Bedside commode;Shower seat (all equipment belongs to pt's brother)          Prior Functioning/Environment Level of Independence: Independent             OT Diagnosis:  OT Problem List:     OT Treatment/Interventions:      OT Goals(Current goals can be found in the care plan section) Acute Rehab OT Goals Patient Stated Goal: "to get the hell out of here" OT Goal Formulation: With patient Time For Goal Achievement: 01/25/16 Potential to Achieve Goals: Good  OT Frequency:     Barriers to D/C:            Co-evaluation              End of Session Equipment Utilized During Treatment: Gait belt Nurse Communication: Mobility status  Activity Tolerance: Patient tolerated treatment well Patient left: with call bell/phone within reach;in chair   Time: 1130-1140 OT Time Calculation (min): 10 min Charges:  OT General Charges $OT Visit: 1  Procedure OT Evaluation $OT Eval Low Complexity: 1 Procedure G-Codes: OT G-codes **NOT FOR INPATIENT CLASS** Functional Assessment Tool Used: clinical judgement Functional Limitation: Self care Self Care Current Status (O1308): 0 percent impaired, limited or restricted Self Care Goal Status (M5784): 0 percent impaired, limited or restricted Self Care Discharge Status (O9629): 0 percent impaired, limited or restricted  Nils Pyle, OTR/L Pager: [phone] 01/11/2016, 11:44 AM

## 2016-01-11 NOTE — Progress Notes (Signed)
Family Medicine Teaching Service Daily Progress Note Intern Pager: [phone]  Patient name: Rhonda Perkins Medical record number: [id number] Date of birth: 1961-06-05 Age: 55 y.o. Gender: female  Primary Care Provider: No primary care provider on file. Consultants: Neurology Code Status: Full  Pt Overview and Major Events to Date:  7/21- Admitted for TIA work up  Assessment and Plan: Rhonda Perkins is a 55 y.o. female presenting with left arm numbness. PMH is significant for HLD and tobacco abuse.  Transient Left Arm Numbness, resolved. TIA vs brachial plexus injury. Concern for TIA. ABCD2 score 2. CT head negative. MR/MRA brain negative. Symptoms currently completely resolved. A1c 5.4 %. TSH normal. - Start ASA  - Continue simvastatin (10 year ASCVD risk 5.1%) - Check echo and carotid dopplers  HLD.  - Continue home simvastatin  Tobacco Abuse. - Encourage cessation  FEN/GI: Heart health, carb modified diet, SLIV Prophylaxis: Lovenox  Disposition: Placed in observation. Anticipate discharge home later today.   Subjective:  Mild left arm pain today. Otherwise doing well. No residual numbness or weakness. No other complaints.   Objective: Temp:  [97.9 F (36.6 C)-98.2 F (36.8 C)] 98.2 F (36.8 C) (07/21 2200) Pulse Rate:  [63-75] 71 (07/21 2200) Resp:  [14-18] 18 (07/21 2200) BP: (90-142)/(64-78) 90/64 mmHg (07/21 2200) SpO2:  [97 %-100 %] 98 % (07/21 2200) Weight:  [122 lb (55.339 kg)] 122 lb (55.339 kg) (07/21 1353) Physical Exam: General: 55 year old female in NAD lying in hospital bed Cardiovascular: RRR, no murmurs Respiratory: CTAB, NWOB Abdomen: +BS, S, NT, ND Extremities: WWP, no cyanosis or edema Neuro: CN2-12 intact. Strength 5/5 in upper and lower extremities. FNF intact bilaterally. No pronator drift. Spurling negative bilaterally.   Laboratory: Lipid Panel     Component Value Date/Time   CHOL 188 01/11/2016 0539   TRIG 87 01/11/2016 0539   HDL  47 01/11/2016 0539   CHOLHDL 4.0 01/11/2016 0539   VLDL 17 01/11/2016 0539   LDLCALC 124* 01/11/2016 0539   A1c 5.4  Imaging/Diagnostic Tests: Ct Head Wo Contrast  01/10/2016  CLINICAL DATA:  Patient had numbness in her left arm after weeding eating at 7am EXAM: CT HEAD WITHOUT CONTRAST TECHNIQUE: Contiguous axial images were obtained from the base of the skull through the vertex without intravenous contrast. COMPARISON:  08/23/2003 FINDINGS: Brain: No evidence of acute infarction, hemorrhage, extra-axial collection, ventriculomegaly, or mass effect. Vascular: No hyperdense vessel or unexpected calcification. Skull: Negative for fracture or focal lesion. Sinuses/Orbits: Normal orbits and globes. Clear paranasal sinuses and mastoid air cells. Other: None. IMPRESSION: Normal head CT. Electronically Signed   By: Jeronimo Greaves M.D.   On: 01/10/2016 14:43   Mr Brain Wo Contrast  01/10/2016  CLINICAL DATA:  Episode of left arm numbness 12 hours ago. EXAM: MRI HEAD WITHOUT CONTRAST MRA HEAD WITHOUT CONTRAST TECHNIQUE: Multiplanar, multiecho pulse sequences of the brain and surrounding structures were obtained without intravenous contrast. Angiographic images of the head were obtained using MRA technique without contrast. COMPARISON:  Head CT earlier same day FINDINGS: MRI HEAD FINDINGS Diffusion imaging does not show any acute or subacute infarction. The brainstem and cerebellum are normal. There are a few scattered punctate foci of T2 and FLAIR signal within the cerebral hemispheric white matter consistent with minimal small vessel change and not likely significant. No cortical or large vessel territory infarction. No mass lesion, hemorrhage, hydrocephalus or extra-axial collection. No pituitary mass. No inflammatory sinus disease. No skull or skullbase lesion. MRA HEAD  FINDINGS Both internal carotid arteries are widely patent into the brain. The anterior and middle cerebral vessels are normal without proximal  stenosis, aneurysm or vascular malformation. Large posterior communicating arteries bilaterally. Both vertebral arteries are patent to the basilar. The basilar is a small vessel but is widely patent. Posterior circulation branch vessels are normal, with the PCAs receiving most of there supply from the anterior circulation. IMPRESSION: No acute or significant finding. No evidence of recent stroke. The study is normal with exception of a few punctate white matter foci not likely of any clinical relevance. Intracranial MR angiography is normal. Electronically Signed   By: Paulina Fusi M.D.   On: 01/10/2016 20:07   Mr Maxine Glenn Head/brain Wo Cm  01/10/2016  CLINICAL DATA:  Episode of left arm numbness 12 hours ago. EXAM: MRI HEAD WITHOUT CONTRAST MRA HEAD WITHOUT CONTRAST TECHNIQUE: Multiplanar, multiecho pulse sequences of the brain and surrounding structures were obtained without intravenous contrast. Angiographic images of the head were obtained using MRA technique without contrast. COMPARISON:  Head CT earlier same day FINDINGS: MRI HEAD FINDINGS Diffusion imaging does not show any acute or subacute infarction. The brainstem and cerebellum are normal. There are a few scattered punctate foci of T2 and FLAIR signal within the cerebral hemispheric white matter consistent with minimal small vessel change and not likely significant. No cortical or large vessel territory infarction. No mass lesion, hemorrhage, hydrocephalus or extra-axial collection. No pituitary mass. No inflammatory sinus disease. No skull or skullbase lesion. MRA HEAD FINDINGS Both internal carotid arteries are widely patent into the brain. The anterior and middle cerebral vessels are normal without proximal stenosis, aneurysm or vascular malformation. Large posterior communicating arteries bilaterally. Both vertebral arteries are patent to the basilar. The basilar is a small vessel but is widely patent. Posterior circulation branch vessels are normal,  with the PCAs receiving most of there supply from the anterior circulation. IMPRESSION: No acute or significant finding. No evidence of recent stroke. The study is normal with exception of a few punctate white matter foci not likely of any clinical relevance. Intracranial MR angiography is normal. Electronically Signed   By: Paulina Fusi M.D.   On: 01/10/2016 20:07    Ardith Dark, MD 01/11/2016, 7:13 AM PGY-3, [facility] FPTS Intern pager: [phone], text pages welcome

## 2016-01-11 NOTE — Discharge Summary (Signed)
Family Medicine Teaching [facility] Discharge Summary  Patient name: Rhonda Perkins Medical record number: [id number] Date of birth: [birth year] Age: 55 y.o. Gender: female Date of Admission: 01/10/2016  Date of Discharge: 01/11/2016 Admitting Physician: Nestor Ramp, MD  Primary Care Provider: No primary care provider on file. Consultants: Neurology  Indication for Hospitalization: Left arm weakness/numbess  Discharge Diagnoses/Problem List:  Left arm weakness/numbness, HLD, Tobacco abse  Disposition: Home  Discharge Condition: Improved  Discharge Exam: General: 55 year old female in NAD lying in hospital bed Cardiovascular: RRR, no murmurs Respiratory: CTAB, NWOB Abdomen: +BS, S, NT, ND Extremities: WWP, no cyanosis or edema Neuro: CN2-12 intact. Strength 5/5 in upper and lower extremities. FNF intact bilaterally. No pronator drift. Spurling negative bilaterally.   Brief Hospital Course:  Patient is a 55 year old female who presented with a 15-30 minute episode of left arm numbness and weakness. Her symptoms had completely resolved by time of presentation to the ED. She was admitted for a TIA work up. Her head CT and MRI brain were negative, internal carotids widely patent. She had no events on telemetry. She had an echocardiogram performed with results pending at the time of discharge. Her risk stratification labs were significant for an A1c of 5.4% and a lipid panel consistent with a 10 year ASCVD risk of 5.1%. We started on her on aspirin 81mg  daily for primary prevention and continued her  Issues for Follow Up:  1. Please follow up final read of Echocardiogram 2. Encourage smoking cessation  Significant Procedures: None  Significant Labs and Imaging:   Recent Labs Lab 01/10/16 1318 01/10/16 1326  WBC 7.2  --   HGB 13.8 13.9  HCT 41.5 41.0  PLT 235  --     Recent Labs Lab 01/10/16 1318 01/10/16 1326  NA 142 145  K 4.0 4.2  CL 109 106  CO2 26  --   GLUCOSE  86 82  BUN <5* 8  CREATININE 0.73 0.70  CALCIUM 9.8  --   ALKPHOS 65  --   AST 20  --   ALT 16  --   ALBUMIN 4.4  --    Troponin 0 A1c 5.4% TSH 1.556 EKG: NSR, no ischemic changes  Echo pending  Ct Head Wo Contrast  01/10/2016  CLINICAL DATA:  Patient had numbness in her left arm after weeding eating at 7am EXAM: CT HEAD WITHOUT CONTRAST TECHNIQUE: Contiguous axial images were obtained from the base of the skull through the vertex without intravenous contrast. COMPARISON:  08/23/2003 FINDINGS: Brain: No evidence of acute infarction, hemorrhage, extra-axial collection, ventriculomegaly, or mass effect. Vascular: No hyperdense vessel or unexpected calcification. Skull: Negative for fracture or focal lesion. Sinuses/Orbits: Normal orbits and globes. Clear paranasal sinuses and mastoid air cells. Other: None. IMPRESSION: Normal head CT. Electronically Signed   By: Jeronimo Greaves M.D.   On: 01/10/2016 14:43   Mr Brain Wo Contrast  01/10/2016  CLINICAL DATA:  Episode of left arm numbness 12 hours ago. EXAM: MRI HEAD WITHOUT CONTRAST MRA HEAD WITHOUT CONTRAST TECHNIQUE: Multiplanar, multiecho pulse sequences of the brain and surrounding structures were obtained without intravenous contrast. Angiographic images of the head were obtained using MRA technique without contrast. COMPARISON:  Head CT earlier same day FINDINGS: MRI HEAD FINDINGS Diffusion imaging does not show any acute or subacute infarction. The brainstem and cerebellum are normal. There are a few scattered punctate foci of T2 and FLAIR signal within the cerebral hemispheric white matter consistent with  minimal small vessel change and not likely significant. No cortical or large vessel territory infarction. No mass lesion, hemorrhage, hydrocephalus or extra-axial collection. No pituitary mass. No inflammatory sinus disease. No skull or skullbase lesion. MRA HEAD FINDINGS Both internal carotid arteries are widely patent into the brain. The  anterior and middle cerebral vessels are normal without proximal stenosis, aneurysm or vascular malformation. Large posterior communicating arteries bilaterally. Both vertebral arteries are patent to the basilar. The basilar is a small vessel but is widely patent. Posterior circulation branch vessels are normal, with the PCAs receiving most of there supply from the anterior circulation. IMPRESSION: No acute or significant finding. No evidence of recent stroke. The study is normal with exception of a few punctate white matter foci not likely of any clinical relevance. Intracranial MR angiography is normal. Electronically Signed   By: Paulina Fusi M.D.   On: 01/10/2016 20:07   Mr Maxine Glenn Head/brain Wo Cm  01/10/2016  CLINICAL DATA:  Episode of left arm numbness 12 hours ago. EXAM: MRI HEAD WITHOUT CONTRAST MRA HEAD WITHOUT CONTRAST TECHNIQUE: Multiplanar, multiecho pulse sequences of the brain and surrounding structures were obtained without intravenous contrast. Angiographic images of the head were obtained using MRA technique without contrast. COMPARISON:  Head CT earlier same day FINDINGS: MRI HEAD FINDINGS Diffusion imaging does not show any acute or subacute infarction. The brainstem and cerebellum are normal. There are a few scattered punctate foci of T2 and FLAIR signal within the cerebral hemispheric white matter consistent with minimal small vessel change and not likely significant. No cortical or large vessel territory infarction. No mass lesion, hemorrhage, hydrocephalus or extra-axial collection. No pituitary mass. No inflammatory sinus disease. No skull or skullbase lesion. MRA HEAD FINDINGS Both internal carotid arteries are widely patent into the brain. The anterior and middle cerebral vessels are normal without proximal stenosis, aneurysm or vascular malformation. Large posterior communicating arteries bilaterally. Both vertebral arteries are patent to the basilar. The basilar is a small vessel but is  widely patent. Posterior circulation branch vessels are normal, with the PCAs receiving most of there supply from the anterior circulation. IMPRESSION: No acute or significant finding. No evidence of recent stroke. The study is normal with exception of a few punctate white matter foci not likely of any clinical relevance. Intracranial MR angiography is normal. Electronically Signed   By: Paulina Fusi M.D.   On: 01/10/2016 20:07    Results/Tests Pending at Time of Discharge:  ECHO  Discharge Medications:    Medication List    TAKE these medications        ALEVE 220 MG tablet  Generic drug:  naproxen sodium  Take 440 mg by mouth 2 (two) times daily as needed (pain).     aspirin 81 MG chewable tablet  Chew 1 tablet (81 mg total) by mouth daily.     GOODY HEADACHE PO  Take 1 packet by mouth 2 (two) times daily as needed (headache/ pain).     simvastatin 20 MG tablet  Commonly known as:  ZOCOR  Take 20 mg by mouth at bedtime.     simvastatin 20 MG tablet  Commonly known as:  ZOCOR  Take 1 tablet (20 mg total) by mouth at bedtime.        Discharge Instructions: Please refer to Patient Instructions section of EMR for full details.  Patient was counseled important signs and symptoms that should prompt return to medical care, changes in medications, dietary instructions, activity restrictions, and follow up  appointments.   Follow-Up Appointments: Follow-up Information    Follow up with Your PCP.   Why:  for hospital follow up.       Ardith Dark, MD 01/11/2016, 9:37 AM PGY-3, [facility]

## 2016-01-11 NOTE — Progress Notes (Signed)
STROKE TEAM PROGRESS NOTE   HISTORY OF PRESENT ILLNESS (per record) Rhonda Perkins is an 55 y.o. female with past medical history of high cholesterol and tobacco abuse who presents to the ED with complaint of intermittent left arm weakness, onset 7:30 AM. Patient reports while she was out doing yard work this morning, after using a weed whacker she noticed she was unable to move her left arm. Patient reports she had total left arm weakness for approximately 15 minutes and then notes her strength began to gradually return over the next 30-45 minutes. Patient denies having any similar episodes in the past. She notes she went to her PCPs office but was unable to get an resulting her going to an urgent care. Once patient was evaluated at the urgent care she was advised to come to the ED for further evaluation. Currently back to baseline.   Date last known well: Today Time last known well: Time: 07:30 tPA Given: No: symptoms resolved   SUBJECTIVE (INTERVAL HISTORY) Patient is upset demanding her test should be done as soon as possible. I explained her in detail about TIA and associated risk factors. And her tests are scheduled today but there is a possibility that it may happen tomorrow   OBJECTIVE Temp:  [97.9 F (36.6 C)-98.4 F (36.9 C)] 98.3 F (36.8 C) (07/22 0937) Pulse Rate:  [63-75] 64 (07/22 0937) Cardiac Rhythm:  [-] Normal sinus rhythm (07/22 0809) Resp:  [14-18] 18 (07/22 0937) BP: (90-142)/(42-78) 106/44 mmHg (07/22 0941) SpO2:  [96 %-100 %] 99 % (07/22 0937) Weight:  [55.339 kg (122 lb)] 55.339 kg (122 lb) (07/21 1353) Neurological Examination Mental Status: Alert, oriented, thought content appropriate.  Speech fluent without evidence of aphasia. Able to follow 3 step commands without difficulty. Cranial Nerves: II: ; Visual fields grossly normal, pupils equal, round, reactive to light and accommodation III,IV, VI: ptosis not present, extra-ocular motions intact  bilaterally V,VII: smile symmetric, facial light touch sensation normal bilaterally VIII: hearing normal bilaterally IX,X: uvula rises symmetrically XI: bilateral shoulder shrug XII: midline tongue extension Motor: Right :  Upper extremity   5/5                                      Left:     Upper extremity   5/5             Lower extremity   5/5                                                  Lower extremity   5/5 Tone and bulk:normal tone throughout; no atrophy noted Sensory: Pinprick and light touch intact throughout, bilaterally Deep Tendon Reflexes: 2+ and symmetric throughout Plantars: Right: downgoing                                Left: downgoing Cerebellar: normal finger-to-nose and normal heel-to-shin test Gait: normal gait and station   CBC:   Recent Labs Lab 01/10/16 1318 01/10/16 1326  WBC 7.2  --   NEUTROABS 3.7  --   HGB 13.8 13.9  HCT 41.5 41.0  MCV 87.4  --   PLT 235  --  Basic Metabolic Panel:   Recent Labs Lab 01/10/16 1318 01/10/16 1326  NA 142 145  K 4.0 4.2  CL 109 106  CO2 26  --   GLUCOSE 86 82  BUN <5* 8  CREATININE 0.73 0.70  CALCIUM 9.8  --     Lipid Panel:     Component Value Date/Time   CHOL 188 01/11/2016 0539   TRIG 87 01/11/2016 0539   HDL 47 01/11/2016 0539   CHOLHDL 4.0 01/11/2016 0539   VLDL 17 01/11/2016 0539   LDLCALC 124* 01/11/2016 0539   HgbA1c:  Lab Results  Component Value Date   HGBA1C 5.4 01/10/2016   Urine Drug Screen: No results found for: LABOPIA, COCAINSCRNUR, LABBENZ, AMPHETMU, THCU, LABBARB    IMAGING  Ct Head Wo Contrast 01/10/2016   Normal head CT.   Mr Maxine Glenn Head/brain Wo Cm 01/10/2016   No acute or significant finding. No evidence of recent stroke. The study is normal with exception of a few punctate white matter foci not likely of any clinical relevance. Intracranial MR angiography is normal.     PHYSICAL EXAM  Neurological Examination Mental Status: Alert, oriented, thought  content appropriate.  Speech fluent without evidence of aphasia. Able to follow 3 step commands without difficulty. Cranial Nerves: II: ; Visual fields grossly normal, pupils equal, round, reactive to light and accommodation III,IV, VI: ptosis not present, extra-ocular motions intact bilaterally V,VII: smile symmetric, facial light touch sensation normal bilaterally VIII: hearing normal bilaterally IX,X: uvula rises symmetrically XI: bilateral shoulder shrug XII: midline tongue extension Motor: Right :  Upper extremity   5/5                                      Left:     Upper extremity   5/5             Lower extremity   5/5                                                  Lower extremity   5/5 Tone and bulk:normal tone throughout; no atrophy noted Sensory: Pinprick and light touch intact throughout, bilaterally Deep Tendon Reflexes: 2+ and symmetric throughout Plantars: Right: downgoing                                Left: downgoing Cerebellar: normal finger-to-nose and normal heel-to-shin test Gait: normal gait and station   ASSESSMENT/PLAN [name] is a 55 y.o. female with history of depression and hyperlipidemia presenting with transient left arm weakness. She did not receive IV t-PA due to resolution of deficits.  Possible TIA:  Non-dominant   Resultant - resolution of deficits  MRI  - No acute or significant finding  MRA head  - Normal  Carotid Doppler - pending  2D Echo pending  LDL 124  HgbA1c 5.4  VTE prophylaxis - Lovenox Diet heart healthy/carb modified Room service appropriate?: Yes; Fluid consistency:: Thin  No antithrombotic prior to admission, now on aspirin 81 mg daily  Patient counseled to be compliant with her antithrombotic medications  Ongoing aggressive stroke risk factor management  Therapy recommendations: Pending  Disposition: Pending  Hypertension  Blood pressure tends  to run low (93/42 - 106/44 Saturday) no antihypertensive  medications now or prior to admission  Permissive hypertension (OK if < 220/120) but gradually normalize in 5-7 days  Long-term BP goal normotensive  Hyperlipidemia  Home meds: Zocor 20 mg daily resumed in hospital  LDL 124, goal < 70  Zocor increased to 40 mg daily  Continue statin at discharge    Other Stroke Risk Factors  Cigarette smoker - advised to stop smoking    Z. Willodean Rosenthal MD Neurology    To contact Stroke Continuity provider, please refer to [email or web address]. After hours, contact General Neurology

## 2016-01-11 NOTE — Progress Notes (Signed)
PT Cancellation Note  Patient Details Name: Rhonda Perkins MRN: [id number] DOB: 31-Oct-1960   Cancelled Treatment:    Reason Eval/Treat Not Completed: PT screened, no needs identified, will sign off.  See OT evaluation for further details.   Freida Busman, Milam Allbaugh L 01/11/2016, 2:52 PM

## 2016-01-11 NOTE — Progress Notes (Signed)
Pt d/c to home by car with family. Assessment stable. All questions answered. Pt refused to be taken down by wheelchair

## 2016-01-11 NOTE — Progress Notes (Signed)
VASCULAR LAB PRELIMINARY  PRELIMINARY  PRELIMINARY  PRELIMINARY  Carotid duplex completed.    Preliminary report:  1-39% ICA plaquing. Vertebral artery flow is antegrade.   Enzley Kitchens, RVT 01/11/2016, 1:45 PM

## 2016-01-11 NOTE — Progress Notes (Signed)
SLP Cancellation Note  Patient Details Name: PEGI HEIBERGER MRN: [id number] DOB: 1960/07/10   Cancelled treatment:       Reason Eval/Treat Not Completed: Other (comment) Pt passed RN stroke swallow screen, hence, no SLP swallow eval warranted per protocol.   Blenda Mounts Laurice 01/11/2016, 9:35 AM

## 2016-01-14 LAB — VAS US CAROTID
LCCADDIAS: -29 cm/s
LEFT ECA DIAS: -11 cm/s
LEFT VERTEBRAL DIAS: 16 cm/s
LICADDIAS: -22 cm/s
LICADSYS: -86 cm/s
Left CCA dist sys: -83 cm/s
Left CCA prox dias: 27 cm/s
Left CCA prox sys: 103 cm/s
Left ICA prox dias: -47 cm/s
Left ICA prox sys: -103 cm/s
RCCADSYS: -93 cm/s
RCCAPDIAS: 31 cm/s
RCCAPSYS: 98 cm/s
RIGHT ECA DIAS: -12 cm/s
RIGHT VERTEBRAL DIAS: -13 cm/s

## 2016-01-16 ENCOUNTER — Encounter: Payer: Self-pay | Admitting: Family Medicine

## 2016-03-23 ENCOUNTER — Other Ambulatory Visit: Payer: Self-pay | Admitting: Obstetrics & Gynecology

## 2016-03-24 LAB — CYTOLOGY - PAP

## 2016-07-01 ENCOUNTER — Emergency Department (HOSPITAL_COMMUNITY)
Admission: EM | Admit: 2016-07-01 | Discharge: 2016-07-01 | Disposition: A | Discharge status: Home / Self Care | Payer: Managed Care, Other (non HMO) | Attending: Emergency Medicine | Admitting: Emergency Medicine

## 2016-07-01 ENCOUNTER — Encounter (HOSPITAL_COMMUNITY): Payer: Self-pay | Admitting: Emergency Medicine

## 2016-07-01 DIAGNOSIS — Z7982 Long term (current) use of aspirin: Secondary | ICD-10-CM | POA: Insufficient documentation

## 2016-07-01 DIAGNOSIS — Z8673 Personal history of transient ischemic attack (TIA), and cerebral infarction without residual deficits: Secondary | ICD-10-CM | POA: Diagnosis not present

## 2016-07-01 DIAGNOSIS — R51 Headache: Secondary | ICD-10-CM | POA: Diagnosis present

## 2016-07-01 DIAGNOSIS — F17210 Nicotine dependence, cigarettes, uncomplicated: Secondary | ICD-10-CM | POA: Diagnosis not present

## 2016-07-01 DIAGNOSIS — J321 Chronic frontal sinusitis: Secondary | ICD-10-CM | POA: Insufficient documentation

## 2016-07-01 DIAGNOSIS — J0110 Acute frontal sinusitis, unspecified: Secondary | ICD-10-CM

## 2016-07-01 HISTORY — DX: Hyperlipidemia, unspecified: E78.5

## 2016-07-01 MED ORDER — HYDROCODONE-ACETAMINOPHEN 5-325 MG PO TABS: 2.0000 | ORAL_TABLET | ORAL | 0 refills | Status: AC | PRN

## 2016-07-01 MED ORDER — LEVOFLOXACIN 750 MG PO TABS: 750.0000 mg | ORAL_TABLET | Freq: Every day | ORAL | 0 refills | Status: AC

## 2016-07-01 NOTE — ED Provider Notes (Signed)
I assigned myself to this patient, they were placed in fast track. I introduced myself, patient states "you're a PA, you're not a real doctor". I stated that I was indeed a physician assistant, she stated that she would like to see a real doctor. Patient will be placed back into the waiting room as fast track as other patients that are waiting to be seen, I informed nursing staff to place her in the back when an appropriate bed becomes available. Other providers were notified that patient wanted to be seen by a doctor.    Rhonda MechanicJeffrey Gleason Ardoin, PA-C 07/01/16 1254    Courteney Randall AnLyn Mackuen, MD 07/01/16 1332

## 2016-07-01 NOTE — ED Triage Notes (Addendum)
Patient [facility] Saturday and diagnosed with sinus infection.  States that she began taking amoxicillin without relief since Saturday.  States she does not feel any better.  Reports chills, generalized body aches

## 2016-07-01 NOTE — ED Notes (Addendum)
Patient requesting to see doctor.  Per ED PA patient to go to lobby to wait for room in back.  Patient states "Why didn't you ask me before you just sent the PA in here?".  Patient informed that we don't ask anyone and that in the ER we have PAs, NPs, and MDs and that MDs are always supervising the PAs and NPs.  Patient voiced understanding.  Patient back to lobby to wait for room to be seen by MD.  Notified patient that she would be waiting. CN made aware.

## 2016-07-01 NOTE — Discharge Instructions (Signed)
Stop taking the amoxicillin. You may use Tylenol as needed for body aches. Follow-up with your doctor as needed

## 2016-07-01 NOTE — ED Provider Notes (Signed)
[facility]-EMERGENCY DEPT Provider Note   CSN: [id number] Arrival date & time: 07/01/16  1142     History   Chief Complaint Chief Complaint  Patient presents with  . Generalized Body Aches    HPI Rhonda Perkins is a 56 y.o. female.  56 year old female presents with tender 12 day history of nasal congestion and URI symptoms. Seen by her Dr. several days ago and placed on amoxicillin but symptoms have not been improving. She does have a history of tobacco use. Denies any fever. Complains of frontal sinus headache with posterior nasal drip. No neck pain headache or photophobia. Denies any dyspnea. Cough is been minimal. No vomiting or diarrhea. Has had some muscle aches as well and has been using NSAIDs without relief      Past Medical History:  Diagnosis Date  . Depression   . Hyperlipidemia     Patient Active Problem List   Diagnosis Date Noted  . Hyperlipidemia   . HLD (hyperlipidemia)   . TIA (transient ischemic attack) 01/10/2016  . Left arm weakness     History reviewed. No pertinent surgical history.  OB History    No data available       Home Medications    Prior to Admission medications   Medication Sig Start Date End Date Taking? Authorizing Provider  aspirin 81 MG chewable tablet Chew 1 tablet (81 mg total) by mouth daily. 01/11/16   Ardith Darkaleb M Parker, MD  Aspirin-Acetaminophen-Caffeine (GOODY HEADACHE PO) Take 1 packet by mouth 2 (two) times daily as needed (headache/ pain).    Historical Provider, MD  naproxen sodium (ALEVE) 220 MG tablet Take 440 mg by mouth 2 (two) times daily as needed (pain).    Historical Provider, MD  simvastatin (ZOCOR) 20 MG tablet Take 20 mg by mouth at bedtime.    Historical Provider, MD  simvastatin (ZOCOR) 20 MG tablet Take 1 tablet (20 mg total) by mouth at bedtime. 01/11/16   Ardith Darkaleb M Parker, MD    Family History Family History  Problem Relation Age of Onset  . Hyperlipidemia Mother   . Hyperlipidemia Father   . Depression  Brother   . Depression Brother   . Seizures Brother     Social History Social History  Substance Use Topics  . Smoking status: Current Every Day Smoker    Packs/day: 0.50    Years: 28.00    Types: Cigarettes  . Smokeless tobacco: Never Used  . Alcohol use No     Allergies   Patient has no known allergies.   Review of Systems Review of Systems  All other systems reviewed and are negative.    Physical Exam Updated Vital Signs BP 114/69 (BP Location: Left Arm)   Pulse 80   Temp 97.8 F (36.6 C) (Oral)   Resp 18   Ht 5\' 7"  (1.702 m)   Wt 52.2 kg   LMP  (LMP Unknown)   SpO2 98%   BMI 18.01 kg/m   Physical Exam  Constitutional: She is oriented to person, place, and time. She appears well-developed and well-nourished.  Non-toxic appearance. No distress.  HENT:  Head: Normocephalic and atraumatic.  Eyes: Conjunctivae, EOM and lids are normal. Pupils are equal, round, and reactive to light.  Neck: Normal range of motion. Neck supple. No tracheal deviation present. No thyroid mass present.  Cardiovascular: Normal rate, regular rhythm and normal heart sounds.  Exam reveals no gallop.   No murmur heard. Pulmonary/Chest: Effort normal and breath sounds normal. No  stridor. No respiratory distress. She has no decreased breath sounds. She has no wheezes. She has no rhonchi. She has no rales.  Abdominal: Soft. Normal appearance and bowel sounds are normal. She exhibits no distension. There is no tenderness. There is no rebound and no CVA tenderness.  Musculoskeletal: Normal range of motion. She exhibits no edema or tenderness.  Neurological: She is alert and oriented to person, place, and time. She has normal strength. No cranial nerve deficit or sensory deficit. GCS eye subscore is 4. GCS verbal subscore is 5. GCS motor subscore is 6.  Skin: Skin is warm and dry. No abrasion and no rash noted.  Psychiatric: She has a normal mood and affect. Her speech is normal and behavior is  normal.  Nursing note and vitals reviewed.    ED Treatments / Results  Labs (all labs ordered are listed, but only abnormal results are displayed) Labs Reviewed - No data to display  EKG  EKG Interpretation None       Radiology No results found.  Procedures Procedures (including critical care time)  Medications Ordered in ED Medications - No data to display   Initial Impression / Assessment and Plan / ED Course  I have reviewed the triage vital signs and the nursing notes.  Pertinent labs & imaging results that were available during my care of the patient were reviewed by me and considered in my medical decision making (see chart for details).  Clinical Course     Will change patient from amoxicillin to Levaquin. Will place on pain medication and have patient follow-up with her Dr.  Final Clinical Impressions(s) / ED Diagnoses   Final diagnoses:  None    New Prescriptions New Prescriptions   No medications on file     Lorre Nick, MD 07/01/16 1744

## 2016-07-01 NOTE — ED Notes (Signed)
Bed: [facility] Expected date:  Expected time:  Means of arrival:  Comments: No Bed 

## 2017-06-17 IMAGING — CT CT HEAD W/O CM
4 series · 19 of 47 positions shown, 21 images · non-contrast
Comparison: 08/23/2003

CLINICAL DATA: Patient had numbness in her left arm after weeding
eating at 7am

EXAM:
CT HEAD WITHOUT CONTRAST
TECHNIQUE: Contiguous axial images were obtained from the base of the skull
through the vertex without intravenous contrast.

[Series 201: head w/o, idose (1) · axial · non-contrast · 0.42mm/px · z∈[+353,+463]mm · 8 of 30 slices shown, 10 images]
[im 4/30  brain]
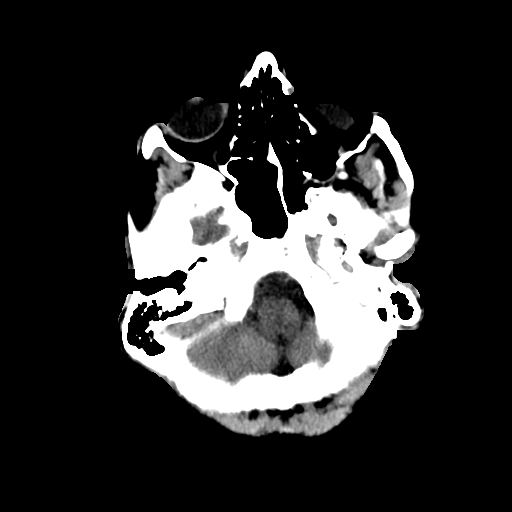
[im 4/30  bone]
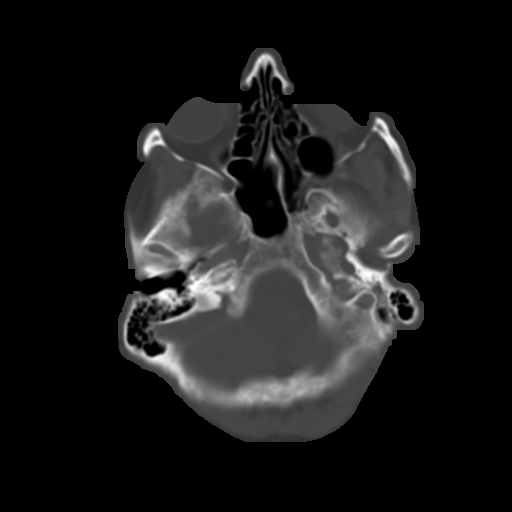
[im 7/30  brain]
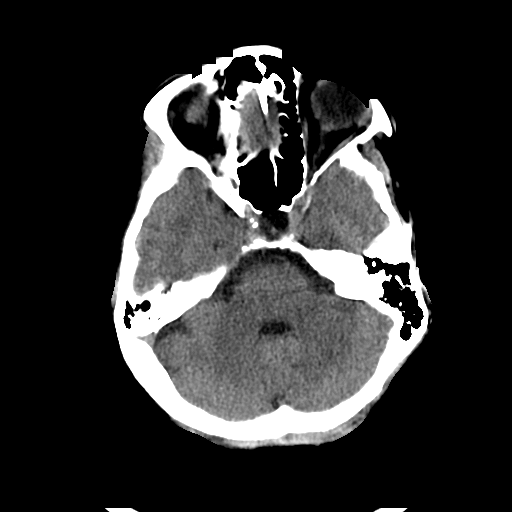
[im 10/30  brain]
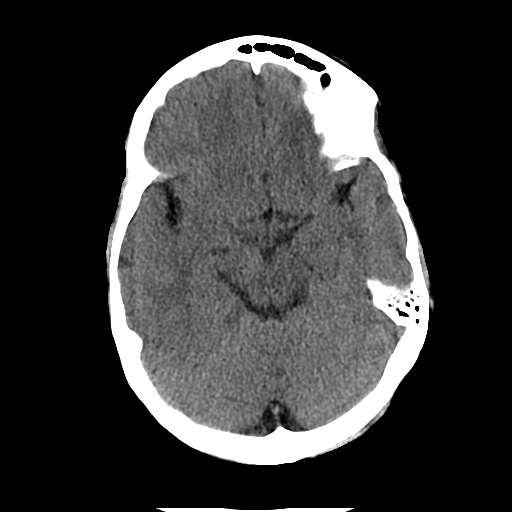
[im 13/30  brain]
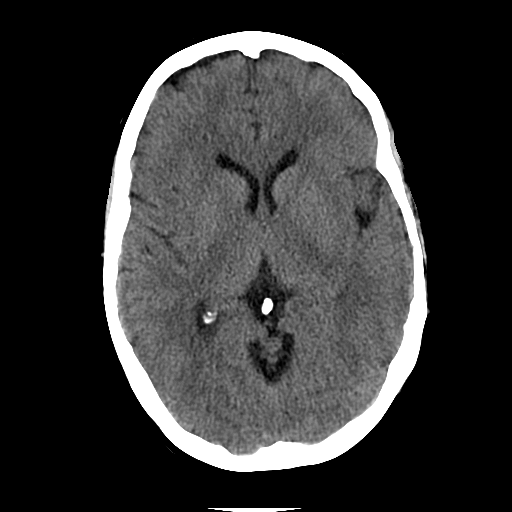
[im 17/30  brain]
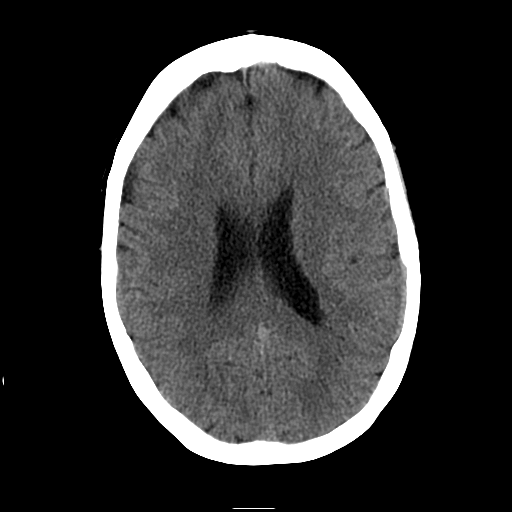
[im 17/30  bone]
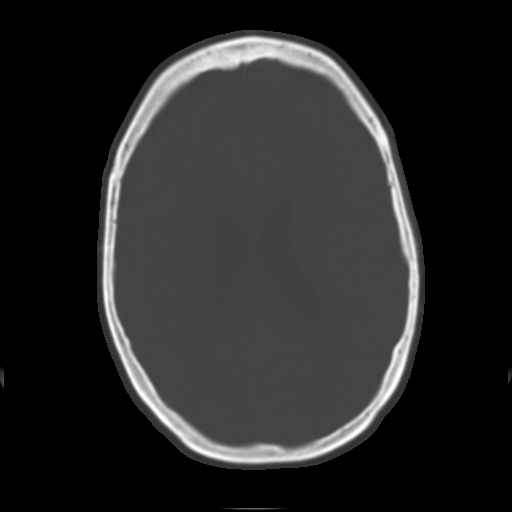
[im 20/30  brain]
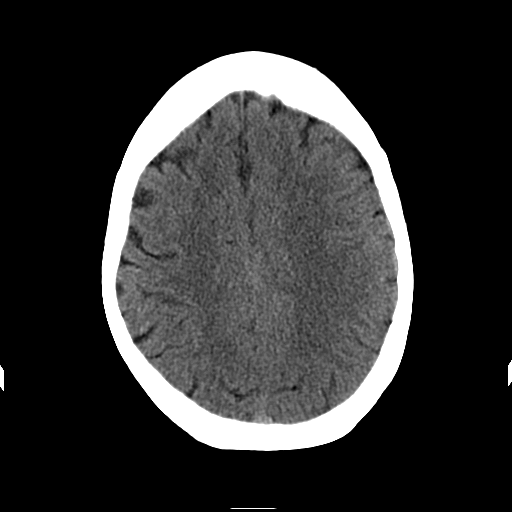
[im 23/30  brain]
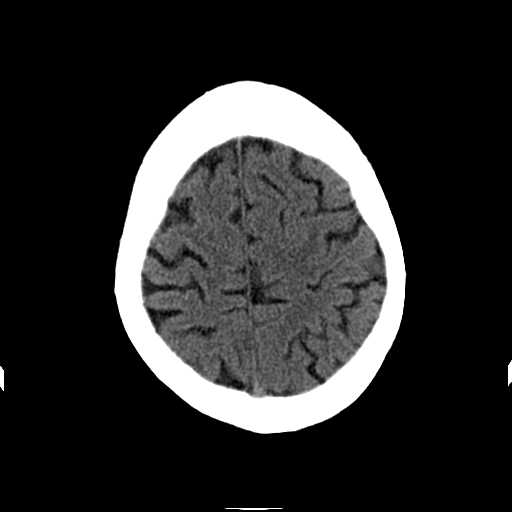
[im 26/30  brain]
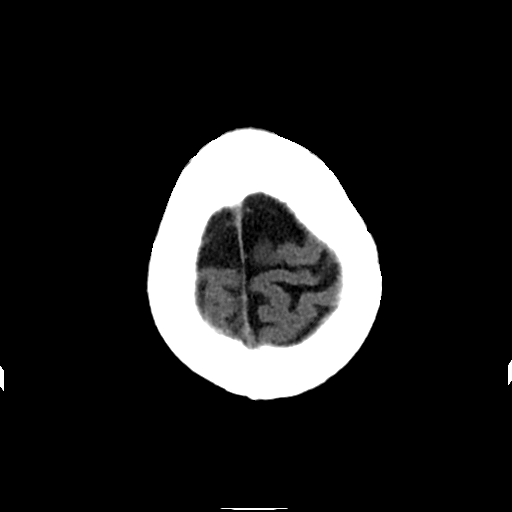

[Series 202: head w/o bone, idose (1) · axial · non-contrast · 0.42mm/px · z∈[+352,+422]mm · 5 of 60 slices shown]
[im 7/60  bone]
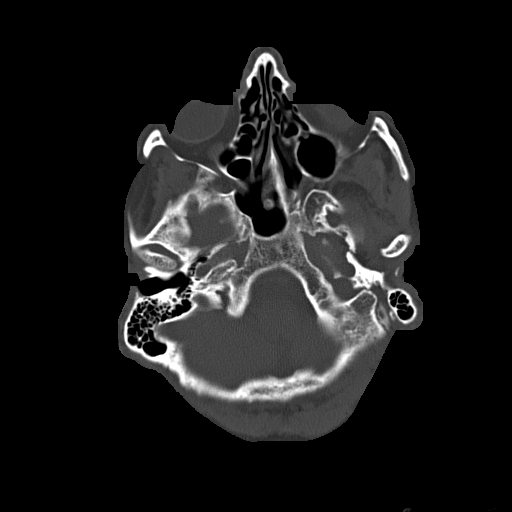
[im 13/60  bone]
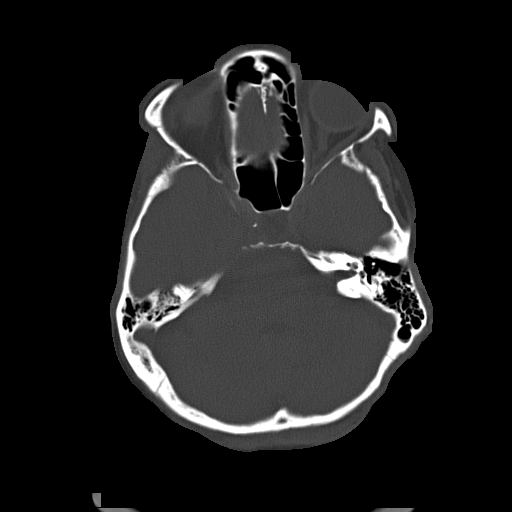
[im 19/60  bone]
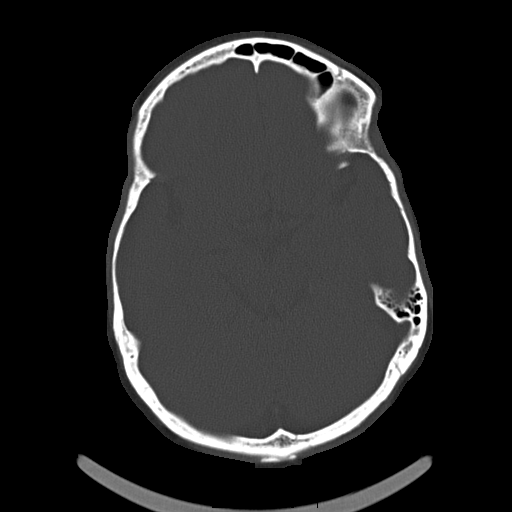
[im 25/60  bone]
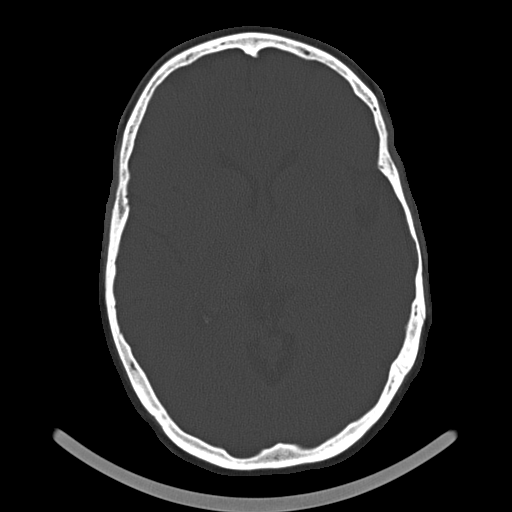
[im 35/60  bone]
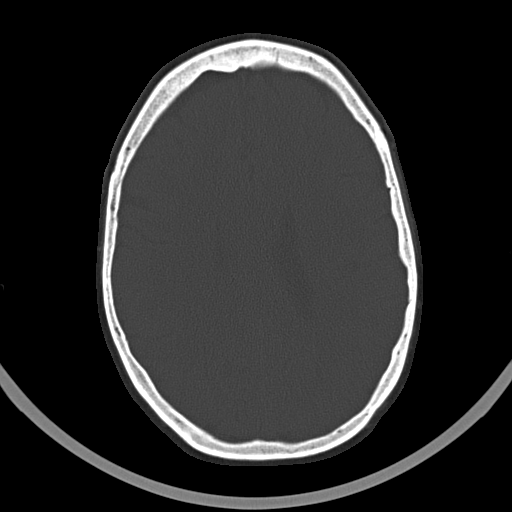

[Series 203: coronal st, idose (1) · coronal · 0.40mm/px · 3 of 69 slices shown]
[im 23/69  brain]
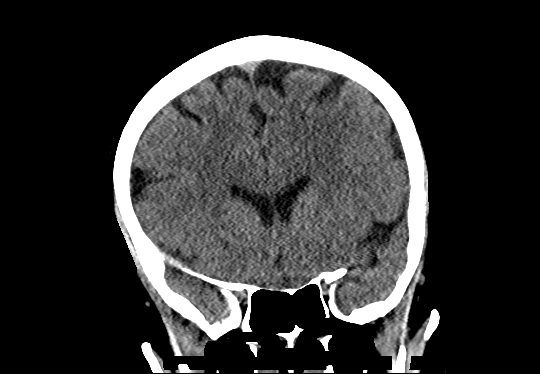
[im 31/69  brain]
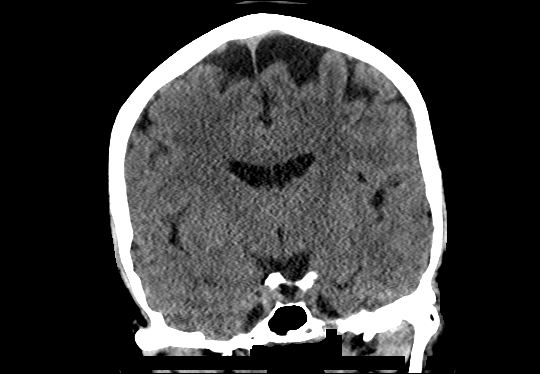
[im 38/69  brain]
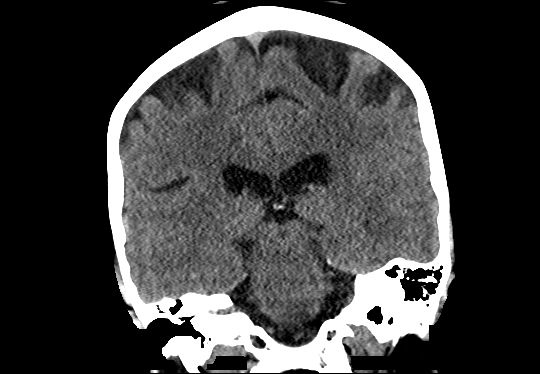

[Series 204: sagittal st, idose (1) · sagittal · 0.40mm/px · 3 of 72 slices shown]
[im 24/72  brain]
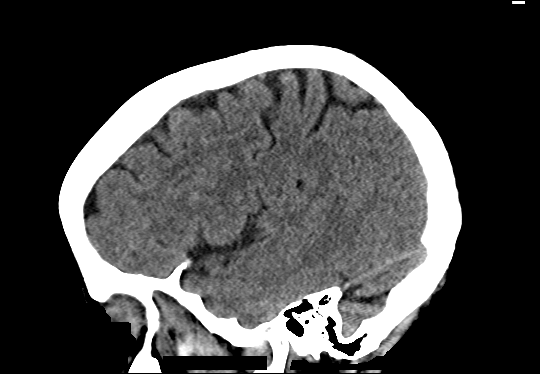
[im 36/72  brain]
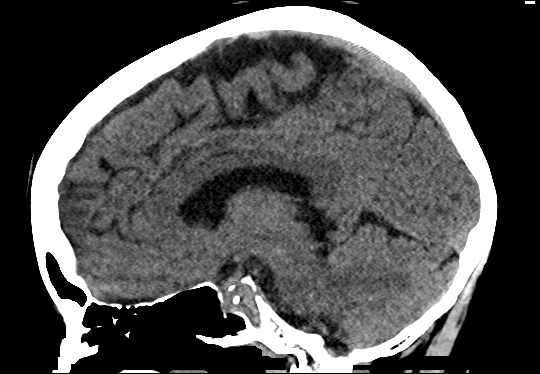
[im 48/72  brain]
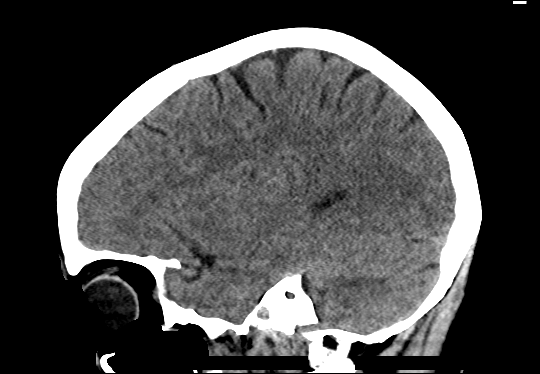

[19 of 47 positions shown; findings below may reference images not displayed]

FINDINGS: Brain: No evidence of acute infarction, hemorrhage, extra-axial
collection, ventriculomegaly, or mass effect.

Vascular: No hyperdense vessel or unexpected calcification.

Skull: Negative for fracture or focal lesion.

Sinuses/Orbits: Normal orbits and globes. Clear paranasal sinuses
and mastoid air cells.

Other: None.
IMPRESSION: Normal head CT.

## 2021-08-12 ENCOUNTER — Other Ambulatory Visit: Payer: Self-pay | Admitting: Obstetrics & Gynecology

## 2021-08-12 DIAGNOSIS — R928 Other abnormal and inconclusive findings on diagnostic imaging of breast: Secondary | ICD-10-CM

## 2022-06-05 ENCOUNTER — Ambulatory Visit
Admission: RE | Admit: 2022-06-05 | Discharge: 2022-06-05 | Disposition: A | Discharge status: Home / Self Care | Payer: Managed Care, Other (non HMO) | Source: Ambulatory Visit | Attending: Obstetrics & Gynecology | Admitting: Obstetrics & Gynecology

## 2022-06-05 ENCOUNTER — Ambulatory Visit
Admission: RE | Admit: 2022-06-05 | Discharge: 2022-06-05 | Disposition: A | Discharge status: Home / Self Care | Payer: 59 | Source: Ambulatory Visit | Attending: Obstetrics & Gynecology | Admitting: Obstetrics & Gynecology

## 2022-06-05 DIAGNOSIS — R928 Other abnormal and inconclusive findings on diagnostic imaging of breast: Secondary | ICD-10-CM

## 2023-08-26 ENCOUNTER — Other Ambulatory Visit: Payer: Self-pay | Admitting: Obstetrics & Gynecology

## 2023-08-26 DIAGNOSIS — R928 Other abnormal and inconclusive findings on diagnostic imaging of breast: Secondary | ICD-10-CM

## 2023-10-19 ENCOUNTER — Ambulatory Visit
Admission: RE | Admit: 2023-10-19 | Discharge: 2023-10-19 | Disposition: A | Discharge status: Home / Self Care | Source: Ambulatory Visit | Attending: Obstetrics & Gynecology | Admitting: Obstetrics & Gynecology

## 2023-10-19 DIAGNOSIS — R928 Other abnormal and inconclusive findings on diagnostic imaging of breast: Secondary | ICD-10-CM

## 2023-10-28 ENCOUNTER — Other Ambulatory Visit: Payer: Self-pay | Admitting: Obstetrics & Gynecology

## 2023-10-28 DIAGNOSIS — R921 Mammographic calcification found on diagnostic imaging of breast: Secondary | ICD-10-CM

## 2024-05-01 ENCOUNTER — Ambulatory Visit
Admission: RE | Admit: 2024-05-01 | Discharge: 2024-05-01 | Disposition: A | Source: Ambulatory Visit | Attending: Obstetrics & Gynecology | Admitting: Obstetrics & Gynecology

## 2024-05-01 ENCOUNTER — Other Ambulatory Visit: Payer: Self-pay | Admitting: Obstetrics & Gynecology

## 2024-05-01 DIAGNOSIS — R928 Other abnormal and inconclusive findings on diagnostic imaging of breast: Secondary | ICD-10-CM

## 2024-05-01 DIAGNOSIS — R921 Mammographic calcification found on diagnostic imaging of breast: Secondary | ICD-10-CM

## 2024-10-30 ENCOUNTER — Encounter
